# Patient Record
Sex: Male | Born: 1975 | Race: White | Hispanic: No | Marital: Married | Smoking: Never smoker
Health system: Southern US, Community
[De-identification: ages and names within clinical notes are randomized; demographics above are authoritative.]

## PROBLEM LIST (undated history)

## (undated) DIAGNOSIS — Z789 Other specified health status: Secondary | ICD-10-CM

---

## 2005-10-11 ENCOUNTER — Emergency Department: Payer: Self-pay | Admitting: Emergency Medicine

## 2009-09-16 ENCOUNTER — Emergency Department: Payer: Self-pay | Admitting: Unknown Physician Specialty

## 2021-03-17 ENCOUNTER — Other Ambulatory Visit: Payer: Self-pay | Admitting: Gastroenterology

## 2021-03-17 ENCOUNTER — Other Ambulatory Visit (HOSPITAL_COMMUNITY): Payer: Self-pay | Admitting: Gastroenterology

## 2021-03-17 DIAGNOSIS — R7401 Elevation of levels of liver transaminase levels: Secondary | ICD-10-CM

## 2021-03-17 DIAGNOSIS — K760 Fatty (change of) liver, not elsewhere classified: Secondary | ICD-10-CM

## 2021-03-23 ENCOUNTER — Observation Stay
Admission: EM | Admit: 2021-03-23 | Discharge: 2021-03-23 | Disposition: A | Discharge status: Home / Self Care | Payer: BC Managed Care – PPO | Attending: Surgery | Admitting: Surgery

## 2021-03-23 ENCOUNTER — Observation Stay: Payer: BC Managed Care – PPO

## 2021-03-23 ENCOUNTER — Emergency Department: Payer: BC Managed Care – PPO

## 2021-03-23 ENCOUNTER — Other Ambulatory Visit: Payer: Self-pay

## 2021-03-23 ENCOUNTER — Inpatient Hospital Stay (HOSPITAL_COMMUNITY)
Admission: AD | Admit: 2021-03-23 | Discharge: 2021-03-27 | DRG: 418 | Disposition: A | Discharge status: Home / Self Care | Payer: BC Managed Care – PPO | Source: Other Acute Inpatient Hospital | Attending: General Surgery | Admitting: General Surgery

## 2021-03-23 DIAGNOSIS — Z6841 Body Mass Index (BMI) 40.0 and over, adult: Secondary | ICD-10-CM | POA: Diagnosis not present

## 2021-03-23 DIAGNOSIS — K8050 Calculus of bile duct without cholangitis or cholecystitis without obstruction: Principal | ICD-10-CM | POA: Diagnosis present

## 2021-03-23 DIAGNOSIS — K8071 Calculus of gallbladder and bile duct without cholecystitis with obstruction: Secondary | ICD-10-CM | POA: Diagnosis not present

## 2021-03-23 DIAGNOSIS — R109 Unspecified abdominal pain: Secondary | ICD-10-CM

## 2021-03-23 DIAGNOSIS — K760 Fatty (change of) liver, not elsewhere classified: Secondary | ICD-10-CM | POA: Diagnosis present

## 2021-03-23 DIAGNOSIS — R7401 Elevation of levels of liver transaminase levels: Secondary | ICD-10-CM | POA: Diagnosis not present

## 2021-03-23 DIAGNOSIS — K8064 Calculus of gallbladder and bile duct with chronic cholecystitis without obstruction: Principal | ICD-10-CM | POA: Diagnosis present

## 2021-03-23 DIAGNOSIS — R1011 Right upper quadrant pain: Secondary | ICD-10-CM | POA: Diagnosis present

## 2021-03-23 DIAGNOSIS — R7989 Other specified abnormal findings of blood chemistry: Secondary | ICD-10-CM | POA: Diagnosis not present

## 2021-03-23 DIAGNOSIS — R1013 Epigastric pain: Secondary | ICD-10-CM | POA: Diagnosis present

## 2021-03-23 DIAGNOSIS — Z20822 Contact with and (suspected) exposure to covid-19: Secondary | ICD-10-CM | POA: Diagnosis not present

## 2021-03-23 DIAGNOSIS — K810 Acute cholecystitis: Secondary | ICD-10-CM | POA: Diagnosis present

## 2021-03-23 LAB — URINALYSIS, COMPLETE (UACMP) WITH MICROSCOPIC
Bacteria, UA: NONE SEEN
Bilirubin Urine: NEGATIVE
Glucose, UA: NEGATIVE mg/dL
Hgb urine dipstick: NEGATIVE
Ketones, ur: NEGATIVE mg/dL
Leukocytes,Ua: NEGATIVE
Nitrite: NEGATIVE
Protein, ur: NEGATIVE mg/dL
Specific Gravity, Urine: 1.027 (ref 1.005–1.030)
Squamous Epithelial / HPF: NONE SEEN (ref 0–5)
pH: 6 (ref 5.0–8.0)

## 2021-03-23 LAB — CBC WITH DIFFERENTIAL/PLATELET
Abs Immature Granulocytes: 0.02 10*3/uL (ref 0.00–0.07)
Basophils Absolute: 0.1 10*3/uL (ref 0.0–0.1)
Basophils Relative: 1 %
Eosinophils Absolute: 0.2 10*3/uL (ref 0.0–0.5)
Eosinophils Relative: 3 %
HCT: 44.6 % (ref 39.0–52.0)
Hemoglobin: 14.9 g/dL (ref 13.0–17.0)
Immature Granulocytes: 0 %
Lymphocytes Relative: 14 %
Lymphs Abs: 0.8 10*3/uL (ref 0.7–4.0)
MCH: 29.4 pg (ref 26.0–34.0)
MCHC: 33.4 g/dL (ref 30.0–36.0)
MCV: 88 fL (ref 80.0–100.0)
Monocytes Absolute: 0.4 10*3/uL (ref 0.1–1.0)
Monocytes Relative: 7 %
Neutro Abs: 4.2 10*3/uL (ref 1.7–7.7)
Neutrophils Relative %: 75 %
Platelets: 191 10*3/uL (ref 150–400)
RBC: 5.07 MIL/uL (ref 4.22–5.81)
RDW: 14.3 % (ref 11.5–15.5)
WBC: 5.6 10*3/uL (ref 4.0–10.5)
nRBC: 0 % (ref 0.0–0.2)

## 2021-03-23 LAB — BASIC METABOLIC PANEL
Anion gap: 10 (ref 5–15)
BUN: 10 mg/dL (ref 6–20)
CO2: 24 mmol/L (ref 22–32)
Calcium: 9.1 mg/dL (ref 8.9–10.3)
Chloride: 106 mmol/L (ref 98–111)
Creatinine, Ser: 0.81 mg/dL (ref 0.61–1.24)
GFR, Estimated: >60 mL/min (ref >60)
Glucose, Bld: 111 mg/dL — ABNORMAL HIGH (ref 70–99)
Potassium: 3.5 mmol/L (ref 3.5–5.1)
Sodium: 140 mmol/L (ref 135–145)

## 2021-03-23 LAB — HEPATIC FUNCTION PANEL
ALT: 239 U/L — ABNORMAL HIGH (ref 0–44)
AST: 237 U/L — ABNORMAL HIGH (ref 15–41)
Albumin: 4.4 g/dL (ref 3.5–5.0)
Alkaline Phosphatase: 155 U/L — ABNORMAL HIGH (ref 38–126)
Bilirubin, Direct: 1 mg/dL — ABNORMAL HIGH (ref 0.0–0.2)
Indirect Bilirubin: 1.3 mg/dL — ABNORMAL HIGH (ref 0.3–0.9)
Total Bilirubin: 2.3 mg/dL — ABNORMAL HIGH (ref 0.3–1.2)
Total Protein: 7.8 g/dL (ref 6.5–8.1)

## 2021-03-23 LAB — RESP PANEL BY RT-PCR (FLU A&B, COVID) ARPGX2
Influenza A by PCR: NEGATIVE
Influenza B by PCR: NEGATIVE
SARS Coronavirus 2 by RT PCR: NEGATIVE

## 2021-03-23 LAB — HIV ANTIBODY (ROUTINE TESTING W REFLEX): HIV Screen 4th Generation wRfx: NONREACTIVE

## 2021-03-23 LAB — LIPASE, BLOOD: Lipase: 37 U/L (ref 11–51)

## 2021-03-23 MED ORDER — SODIUM CHLORIDE 0.9 % IV SOLN
2.0000 g | INTRAVENOUS | Status: DC
  Administered 2021-03-23: 2 g via INTRAVENOUS
  Filled 2021-03-23: qty 20
  Filled 2021-03-23: qty 2

## 2021-03-23 MED ORDER — DIPHENHYDRAMINE HCL 25 MG PO CAPS
25.0000 mg | ORAL_CAPSULE | Freq: Four times a day (QID) | ORAL | Status: DC | PRN
  Administered 2021-03-27: 25 mg via ORAL
  Filled 2021-03-23: qty 1

## 2021-03-23 MED ORDER — MORPHINE SULFATE (PF) 4 MG/ML IV SOLN
4.0000 mg | Freq: Once | INTRAVENOUS | Status: AC
  Administered 2021-03-23: 4 mg via INTRAVENOUS
  Filled 2021-03-23: qty 1

## 2021-03-23 MED ORDER — DOCUSATE SODIUM 100 MG PO CAPS: 100.0000 mg | ORAL_CAPSULE | Freq: Two times a day (BID) | ORAL | Status: DC | PRN

## 2021-03-23 MED ORDER — TRAMADOL HCL 50 MG PO TABS: 50.0000 mg | ORAL_TABLET | Freq: Four times a day (QID) | ORAL | Status: DC | PRN

## 2021-03-23 MED ORDER — ONDANSETRON 4 MG PO TBDP: 4.0000 mg | ORAL_TABLET | Freq: Four times a day (QID) | ORAL | Status: DC | PRN

## 2021-03-23 MED ORDER — GADOBUTROL 1 MMOL/ML IV SOLN
10.0000 mL | Freq: Once | INTRAVENOUS | Status: AC | PRN
  Administered 2021-03-23: 10 mL via INTRAVENOUS

## 2021-03-23 MED ORDER — ENOXAPARIN SODIUM 40 MG/0.4ML ~~LOC~~ SOLN
40.0000 mg | SUBCUTANEOUS | Status: DC
  Administered 2021-03-24 – 2021-03-26 (×3): 40 mg via SUBCUTANEOUS
  Filled 2021-03-23 (×3): qty 0.4

## 2021-03-23 MED ORDER — ACETAMINOPHEN 650 MG RE SUPP: 650.0000 mg | Freq: Four times a day (QID) | RECTAL | Status: DC | PRN

## 2021-03-23 MED ORDER — ONDANSETRON HCL 4 MG/2ML IJ SOLN
4.0000 mg | Freq: Once | INTRAMUSCULAR | Status: AC
  Administered 2021-03-23: 4 mg via INTRAVENOUS
  Filled 2021-03-23: qty 2

## 2021-03-23 MED ORDER — ONDANSETRON HCL 4 MG/2ML IJ SOLN: 4.0000 mg | Freq: Four times a day (QID) | INTRAMUSCULAR | Status: DC | PRN

## 2021-03-23 MED ORDER — METOPROLOL TARTRATE 5 MG/5ML IV SOLN: 5.0000 mg | Freq: Four times a day (QID) | INTRAVENOUS | Status: DC | PRN

## 2021-03-23 MED ORDER — ONDANSETRON HCL 4 MG/2ML IJ SOLN
4.0000 mg | Freq: Four times a day (QID) | INTRAMUSCULAR | Status: DC | PRN
  Administered 2021-03-24 – 2021-03-26 (×2): 4 mg via INTRAVENOUS
  Filled 2021-03-23 (×3): qty 2

## 2021-03-23 MED ORDER — DEXTROSE-NACL 5-0.45 % IV SOLN
INTRAVENOUS | Status: DC
  Administered 2021-03-24: 850 mL via INTRAVENOUS

## 2021-03-23 MED ORDER — HYDROCODONE-ACETAMINOPHEN 5-325 MG PO TABS: 1.0000 | ORAL_TABLET | ORAL | Status: DC | PRN

## 2021-03-23 MED ORDER — MORPHINE SULFATE (PF) 2 MG/ML IV SOLN: 2.0000 mg | INTRAVENOUS | Status: DC | PRN

## 2021-03-23 MED ORDER — DIPHENHYDRAMINE HCL 50 MG/ML IJ SOLN
25.0000 mg | Freq: Four times a day (QID) | INTRAMUSCULAR | Status: DC | PRN
Start: 2021-03-23 — End: 2021-03-27
  Administered 2021-03-25: 25 mg via INTRAVENOUS
  Filled 2021-03-23: qty 1

## 2021-03-23 MED ORDER — MORPHINE SULFATE (PF) 2 MG/ML IV SOLN
2.0000 mg | INTRAVENOUS | Status: DC | PRN
  Administered 2021-03-24 – 2021-03-26 (×4): 2 mg via INTRAVENOUS
  Filled 2021-03-23 (×4): qty 1

## 2021-03-23 MED ORDER — SODIUM CHLORIDE 0.9 % IV SOLN: INTRAVENOUS | Status: DC

## 2021-03-23 MED ORDER — OXYCODONE HCL 5 MG PO TABS
5.0000 mg | ORAL_TABLET | ORAL | Status: DC | PRN
  Administered 2021-03-25 – 2021-03-26 (×2): 5 mg via ORAL
  Filled 2021-03-23 (×3): qty 1

## 2021-03-23 MED ORDER — SODIUM CHLORIDE 0.9 % IV BOLUS
500.0000 mL | Freq: Once | INTRAVENOUS | Status: AC
  Administered 2021-03-23: 500 mL via INTRAVENOUS

## 2021-03-23 MED ORDER — ACETAMINOPHEN 325 MG PO TABS: 650.0000 mg | ORAL_TABLET | Freq: Four times a day (QID) | ORAL | Status: DC | PRN

## 2021-03-23 MED ORDER — SODIUM CHLORIDE 0.9 % IV SOLN
2.0000 g | INTRAVENOUS | Status: DC
  Administered 2021-03-24 – 2021-03-26 (×4): 2 g via INTRAVENOUS
  Filled 2021-03-23: qty 20
  Filled 2021-03-23: qty 2
  Filled 2021-03-23: qty 20
  Filled 2021-03-23: qty 2
  Filled 2021-03-23: qty 20

## 2021-03-23 NOTE — Progress Notes (Signed)
Pt arrival to unit. Admission assessment complete. Signed and held orders released. Orders reviewed. Pt and spouse oriented to room and unit. Pt NPO at this time and agreeable to plan of care. Pt denies any pain or discomfort. Call light in reach, safety measures in place. Pt aware to call with any needs/concerns.

## 2021-03-23 NOTE — Progress Notes (Signed)
Pt to be transported to [address] [facility] [address] [address], [place]. Report called to receiving nurse. Care link to transport pt to [facility].

## 2021-03-23 NOTE — ED Notes (Signed)
Pt remains in MRI at this time  

## 2021-03-23 NOTE — H&P (Signed)
Reason for Consult: choledocholithiasis Referring Physician: Domenic MorasSakai  Zarin W Marter is an 45 y.o. male.  HPI: 45 yo male presented with epigastric abdominal pain.  The pain was nonradiating. It was worse with food. It was better with pain medication. It was not constant but came and went. He was seen by [facility] surgery. He was found to have small stone in CBD on MRCP and [facility]. Upon arrival he did not have any pain or nausea.   No past medical history on file.  No past surgical history on file.  No family history on file.  Social History:  reports that he has never smoked. He has never used smokeless tobacco. He reports that he does not drink alcohol and does not use drugs.  Allergies: No Known Allergies  Medications: I have reviewed the patient's current medications.  Results for orders placed or performed during the hospital encounter of 03/23/21 (from the past 48 hour(s))  Basic metabolic panel     Status: Abnormal   Collection Time: 03/23/21  3:57 AM  Result Value Ref Range   Sodium 140 135 - 145 mmol/L   Potassium 3.5 3.5 - 5.1 mmol/L   Chloride 106 98 - 111 mmol/L   CO2 24 22 - 32 mmol/L   Glucose, Bld 111 (H) 70 - 99 mg/dL    Comment: Glucose reference range applies only to samples taken after fasting for at least 8 hours.   BUN 10 6 - 20 mg/dL   Creatinine, Ser 1.610.81 0.61 - 1.24 mg/dL   Calcium 9.1 8.9 - 09.610.3 mg/dL   GFR, Estimated >04>60 >54>60 mL/min    Comment: (NOTE) Calculated using the CKD-EPI Creatinine Equation (2021)    Anion gap 10 5 - 15    Comment: Performed at [facility], [address] [place], [place] [address]  Hepatic function panel     Status: Abnormal   Collection Time: 03/23/21  3:57 AM  Result Value Ref Range   Total Protein 7.8 6.5 - 8.1 g/dL   Albumin 4.4 3.5 - 5.0 g/dL   AST 914237 (H) 15 - 41 U/L   ALT 239 (H) 0 - 44 U/L   Alkaline Phosphatase 155 (H) 38 - 126 U/L   Total Bilirubin 2.3 (H) 0.3 - 1.2  mg/dL   Bilirubin, Direct 1.0 (H) 0.0 - 0.2 mg/dL   Indirect Bilirubin 1.3 (H) 0.3 - 0.9 mg/dL    Comment: Performed at [facility], [address] [place], [place] [address]  Lipase, blood     Status: None   Collection Time: 03/23/21  3:57 AM  Result Value Ref Range   Lipase 37 11 - 51 U/L    Comment: Performed at [facility], [address] [place], [place] [address]  CBC with Differential     Status: None   Collection Time: 03/23/21  3:57 AM  Result Value Ref Range   WBC 5.6 4.0 - 10.5 K/uL   RBC 5.07 4.22 - 5.81 MIL/uL   Hemoglobin 14.9 13.0 - 17.0 g/dL   HCT 86.544.6 78.439.0 - 69.652.0 %   MCV 88.0 80.0 - 100.0 fL   MCH 29.4 26.0 - 34.0 pg   MCHC 33.4 30.0 - 36.0 g/dL   RDW 29.514.3 28.411.5 - 13.215.5 %   Platelets 191 150 - 400 K/uL   nRBC 0.0 0.0 - 0.2 %   Neutrophils Relative % 75 %   Neutro Abs 4.2 1.7 - 7.7 K/uL  Lymphocytes Relative 14 %   Lymphs Abs 0.8 0.7 - 4.0 K/uL   Monocytes Relative 7 %   Monocytes Absolute 0.4 0.1 - 1.0 K/uL   Eosinophils Relative 3 %   Eosinophils Absolute 0.2 0.0 - 0.5 K/uL   Basophils Relative 1 %   Basophils Absolute 0.1 0.0 - 0.1 K/uL   Immature Granulocytes 0 %   Abs Immature Granulocytes 0.02 0.00 - 0.07 K/uL    Comment: Performed at [facility], [address] [place], [place] [address]  Urinalysis, Complete w Microscopic Urine, Clean Catch     Status: Abnormal   Collection Time: 03/23/21  3:57 AM  Result Value Ref Range   Color, Urine AMBER (A) YELLOW    Comment: BIOCHEMICALS MAY BE AFFECTED BY COLOR   APPearance CLEAR (A) CLEAR   Specific Gravity, Urine 1.027 1.005 - 1.030   pH 6.0 5.0 - 8.0   Glucose, UA NEGATIVE NEGATIVE mg/dL   Hgb urine dipstick NEGATIVE NEGATIVE   Bilirubin Urine NEGATIVE NEGATIVE   Ketones, ur NEGATIVE NEGATIVE mg/dL   Protein, ur NEGATIVE NEGATIVE mg/dL   Nitrite NEGATIVE NEGATIVE   Leukocytes,Ua NEGATIVE NEGATIVE   RBC / HPF 0-5 0 - 5 RBC/hpf   WBC, UA 0-5 0 - 5 WBC/hpf    Bacteria, UA NONE SEEN NONE SEEN   Squamous Epithelial / LPF NONE SEEN 0 - 5   Mucus PRESENT     Comment: Performed at [facility], [address], [place], [place] [address]  Resp Panel by RT-PCR (Flu A&B, Covid) Nasopharyngeal Swab     Status: None   Collection Time: 03/23/21  5:49 AM   Specimen: Nasopharyngeal Swab; Nasopharyngeal(NP) swabs in vial transport medium  Result Value Ref Range   SARS Coronavirus 2 by RT PCR NEGATIVE NEGATIVE    Comment: (NOTE) SARS-CoV-2 target nucleic acids are NOT DETECTED.  The SARS-CoV-2 RNA is generally detectable in upper respiratory specimens during the acute phase of infection. The lowest concentration of SARS-CoV-2 viral copies this assay can detect is 138 copies/mL. A negative result does not preclude SARS-Cov-2 infection and should not be used as the sole basis for treatment or other patient management decisions. A negative result may occur with  improper specimen collection/handling, submission of specimen other than nasopharyngeal swab, presence of viral mutation(s) within the areas targeted by this assay, and inadequate number of viral copies(<138 copies/mL). A negative result must be combined with clinical observations, patient history, and epidemiological information. The expected result is Negative.  Fact Sheet for Patients:  BloggerCourse.com  Fact Sheet for Healthcare Providers:  SeriousBroker.it  This test is no t yet approved or cleared by the [place] FDA and  has been authorized for detection and/or diagnosis of SARS-CoV-2 by FDA under an Emergency Use Authorization (EUA). This EUA will remain  in effect (meaning this test can be used) for the duration of the COVID-19 declaration under Section 564(b)(1) of the Act, 21 U.S.C.section 360bbb-3(b)(1), unless the authorization is terminated  or revoked sooner.       Influenza A by PCR NEGATIVE NEGATIVE    Influenza B by PCR NEGATIVE NEGATIVE    Comment: (NOTE) The Xpert Xpress SARS-CoV-2/FLU/RSV plus assay is intended as an aid in the diagnosis of influenza from Nasopharyngeal swab specimens and should not be used as a sole basis for treatment. Nasal washings and aspirates are unacceptable for Xpert Xpress SARS-CoV-2/FLU/RSV testing.  Fact Sheet for Patients: BloggerCourse.com  Fact Sheet for Healthcare Providers: SeriousBroker.it  This test is not yet approved or cleared by the [place] and has been authorized for detection and/or diagnosis of SARS-CoV-2 by FDA under an Emergency Use Authorization (EUA). This EUA will remain in effect (meaning this test can be used) for the duration of the COVID-19 declaration under Section 564(b)(1) of the Act, 21 U.S.C. section 360bbb-3(b)(1), unless the authorization is terminated or revoked.  Performed at [facility], [address] [place], [place] [address]   HIV Antibody (routine testing w rflx)     Status: None   Collection Time: 03/23/21  8:47 AM  Result Value Ref Range   HIV Screen 4th Generation wRfx Non Reactive Non Reactive    Comment: Performed at [facility], [address], [place], [place] [address]    MR 3D Recon At Scanner  Result Date: 03/23/2021 CLINICAL DATA:  Epigastric and right upper quadrant abdominal pain since 8 p.m. last night. Cholelithiasis on ultrasound. EXAM: MRI ABDOMEN WITHOUT AND WITH CONTRAST (INCLUDING MRCP) TECHNIQUE: Multiplanar multisequence MR imaging of the abdomen was performed both before and after the administration of intravenous contrast. Heavily T2-weighted images of the biliary and pancreatic ducts were obtained, and three-dimensional MRCP images were rendered by post processing. CONTRAST:  74mL GADAVIST GADOBUTROL 1 MMOL/ML IV SOLN COMPARISON:  03/23/2021 abdominal sonogram. 09/16/2009 CT abdomen/pelvis. FINDINGS: Lower  chest: No acute abnormality at the lung bases. Hepatobiliary: Normal liver size and configuration. No significant hepatic steatosis. Simple 1.5 cm inferior right liver cyst. No suspicious liver masses. Numerous gallstones layering within the nondistended gallbladder, largest 1.4 cm. No definite gallbladder wall thickening. No pericholecystic fluid. No biliary ductal dilatation. Common bile duct diameter 3 mm. There is a solitary a 3 mm stone in the lower third of the common bile duct (series 11/image 21). No additional biliary filling defects. No biliary beading or strictures. Pancreas: Tiny unilocular 0.5 cm cystic lesion in the anterior uncinate process of the pancreas (series 4/image 29) without wall thickening or solid enhancement. No additional pancreatic lesions. No pancreatic duct dilation. There is pancreas divisum. Spleen: Mild splenomegaly. Craniocaudal splenic length 13.1 cm. No splenic mass. Adrenals/Urinary Tract: Right adrenal 1.3 cm nodule with loss of signal intensity on out of phase chemical shift imaging compatible with an adenoma. No left adrenal nodules. No hydronephrosis. Multiple small simple renal cortical cysts in both kidneys, largest 2.0 cm in the upper right kidney. No suspicious renal masses. Stomach/Bowel: Normal non-distended stomach. Visualized small and large bowel is normal caliber, with no bowel wall thickening. Vascular/Lymphatic: Normal caliber abdominal aorta. Patent portal, splenic, hepatic and renal veins. Mildly enlarged porta hepatis nodes up to the 1.0 cm (series 23/image 35). Other: No abdominal ascites or focal fluid collection. Musculoskeletal: No aggressive appearing focal osseous lesions. Left T12 vertebral hemangioma. IMPRESSION: 1. Cholelithiasis. No MRI findings of acute cholecystitis. 2. Solitary 3 mm choledocholith in the lower third of the common bile duct. No biliary ductal dilatation. CBD diameter 3 mm. 3. Mild porta hepatis lymphadenopathy, nonspecific. Mild  splenomegaly. Suggest attention on follow-up CT abdomen/pelvis with oral and IV contrast in 3 months. 4. Pancreas divisum. 5. Small right adrenal adenoma. Electronically Signed   By: Delbert Phenix M.D.   On: 03/23/2021 08:31   MR ABDOMEN MRCP W WO CONTAST  Result Date: 03/23/2021 CLINICAL DATA:  Epigastric and right upper quadrant abdominal pain since 8 p.m. last night. Cholelithiasis on ultrasound. EXAM: MRI ABDOMEN WITHOUT AND WITH CONTRAST (INCLUDING MRCP) TECHNIQUE: Multiplanar multisequence MR imaging of the  abdomen was performed both before and after the administration of intravenous contrast. Heavily T2-weighted images of the biliary and pancreatic ducts were obtained, and three-dimensional MRCP images were rendered by post processing. CONTRAST:  75mL GADAVIST GADOBUTROL 1 MMOL/ML IV SOLN COMPARISON:  03/23/2021 abdominal sonogram. 09/16/2009 CT abdomen/pelvis. FINDINGS: Lower chest: No acute abnormality at the lung bases. Hepatobiliary: Normal liver size and configuration. No significant hepatic steatosis. Simple 1.5 cm inferior right liver cyst. No suspicious liver masses. Numerous gallstones layering within the nondistended gallbladder, largest 1.4 cm. No definite gallbladder wall thickening. No pericholecystic fluid. No biliary ductal dilatation. Common bile duct diameter 3 mm. There is a solitary a 3 mm stone in the lower third of the common bile duct (series 11/image 21). No additional biliary filling defects. No biliary beading or strictures. Pancreas: Tiny unilocular 0.5 cm cystic lesion in the anterior uncinate process of the pancreas (series 4/image 29) without wall thickening or solid enhancement. No additional pancreatic lesions. No pancreatic duct dilation. There is pancreas divisum. Spleen: Mild splenomegaly. Craniocaudal splenic length 13.1 cm. No splenic mass. Adrenals/Urinary Tract: Right adrenal 1.3 cm nodule with loss of signal intensity on out of phase chemical shift imaging compatible  with an adenoma. No left adrenal nodules. No hydronephrosis. Multiple small simple renal cortical cysts in both kidneys, largest 2.0 cm in the upper right kidney. No suspicious renal masses. Stomach/Bowel: Normal non-distended stomach. Visualized small and large bowel is normal caliber, with no bowel wall thickening. Vascular/Lymphatic: Normal caliber abdominal aorta. Patent portal, splenic, hepatic and renal veins. Mildly enlarged porta hepatis nodes up to the 1.0 cm (series 23/image 35). Other: No abdominal ascites or focal fluid collection. Musculoskeletal: No aggressive appearing focal osseous lesions. Left T12 vertebral hemangioma. IMPRESSION: 1. Cholelithiasis. No MRI findings of acute cholecystitis. 2. Solitary 3 mm choledocholith in the lower third of the common bile duct. No biliary ductal dilatation. CBD diameter 3 mm. 3. Mild porta hepatis lymphadenopathy, nonspecific. Mild splenomegaly. Suggest attention on follow-up CT abdomen/pelvis with oral and IV contrast in 3 months. 4. Pancreas divisum. 5. Small right adrenal adenoma. Electronically Signed   By: Delbert Phenix M.D.   On: 03/23/2021 08:31   US Abdomen Limited RUQ (LIVER/GB)  Result Date: 03/23/2021 CLINICAL DATA:  45 year old male with acute right upper quadrant pain since 2000 hours. EXAM: ULTRASOUND ABDOMEN LIMITED RIGHT UPPER QUADRANT COMPARISON:  CT Abdomen and Pelvis 09/16/2009. FINDINGS: Gallbladder: Gallstones in the gallbladder neck individually up to 12 mm (image 13). These appear to be mo bile on left lateral decubitus images (image 60). Gallbladder wall thickness is at the upper limits of normal, 2-3 mm. No pericholecystic fluid. No sonographic Murphy sign elicited. Common bile duct: Diameter: 5-6 mm, upper limits of normal. Liver: Echogenic liver (image 162). No intrahepatic biliary ductal dilatation. No discrete liver lesion. Portal vein is patent on color Doppler imaging with normal direction of blood flow towards the liver. Other:  Negative visible right kidney IMPRESSION: 1. Cholelithiasis. No strong evidence of acute cholecystitis or bile duct obstruction. 2. Fatty liver disease. Electronically Signed   By: Odessa Fleming M.D.   On: 03/23/2021 05:18    Review of Systems  Constitutional: Negative for chills and fever.  HENT: Negative for hearing loss.   Eyes: Negative for blurred vision and double vision.  Respiratory: Negative for cough and hemoptysis.   Cardiovascular: Negative for chest pain and palpitations.  Gastrointestinal: Negative for abdominal pain, nausea and vomiting.  Genitourinary: Negative for dysuria and urgency.  Musculoskeletal: Negative  for myalgias and neck pain.  Skin: Negative for itching and rash.  Neurological: Negative for dizziness, tingling and headaches.  Endo/Heme/Allergies: Does not bruise/bleed easily.  Psychiatric/Behavioral: Negative for depression and suicidal ideas.    PE There were no vitals taken for this visit. Constitutional: NAD; conversant; no deformities Eyes: Moist conjunctiva; no lid lag; anicteric; PERRL Neck: Trachea midline; no thyromegaly Lungs: Normal respiratory effort; no tactile fremitus CV: RRR; no palpable thrills; no pitting edema GI: Abd soft, NT, ND; no palpable hepatosplenomegaly MSK: Normal gait; no clubbing/cyanosis Psychiatric: Appropriate affect; alert and oriented x3 Lymphatic: No palpable cervical or axillary lymphadenopathy Skin: No major subcutaneous nodules. Warm and dry   Assessment/Plan: 45 yo male with choledocholithiasis, direct bili 1.0. -recheck labs in am -GI consult for possible ERCP -NPO after midnight -IV abx -pain control   [place] Yolunda Kloos 03/23/2021, 11:52 PM

## 2021-03-23 NOTE — Progress Notes (Signed)
Subjective:  CC: Thomas Thompson is a 45 y.o. male  Hospital stay day 0,   choledocolithiasis  HPI: No issues since last exam  ROS:  General: Denies weight loss, weight gain, fatigue, fevers, chills, and night sweats. Heart: Denies chest pain, palpitations, racing heart, irregular heartbeat, leg pain or swelling, and decreased activity tolerance. Respiratory: Denies breathing difficulty, shortness of breath, wheezing, cough, and sputum. GI: Denies change in appetite, heartburn, nausea, vomiting, constipation, diarrhea, and blood in stool. GU: Denies difficulty urinating, pain with urinating, urgency, frequency, blood in urine.   Objective:   Temp:  [98 F (36.7 C)] 98 F (36.7 C) (04/03 0826) Pulse Rate:  [50-68] 50 (04/03 0826) Resp:  [14-28] 14 (04/03 0742) BP: (109-150)/(67-133) 115/79 (04/03 0826) SpO2:  [97 %-99 %] 97 % (04/03 0826) Weight:  [145.2 kg] 145.2 kg (04/03 0342)     Height: 6\' 2"  (188 cm) Weight: (!) 145.2 kg BMI (Calculated): 41.07   Intake/Output this shift:   Intake/Output Summary (Last 24 hours) at 03/23/2021 1041 Last data filed at 03/23/2021 0452 Gross per 24 hour  Intake 500 ml  Output --  Net 500 ml    Constitutional :  alert, cooperative, and appears stated age  Respiratory:  clear to auscultation bilaterally  Cardiovascular:  regular rate and rhythm  Gastrointestinal: soft, non-tender; bowel sounds normal; no masses,  no organomegaly.   Skin: Cool and moist.   Psychiatric: Normal affect, non-agitated, not confused       LABS:  CMP Latest Ref Rng & Units 03/23/2021  Glucose 70 - 99 mg/dL 05/23/2021)  BUN 6 - 20 mg/dL 10  Creatinine 937(T - 0.24 mg/dL 0.97  Sodium 3.53 - 299 mmol/L 140  Potassium 3.5 - 5.1 mmol/L 3.5  Chloride 98 - 111 mmol/L 106  CO2 22 - 32 mmol/L 24  Calcium 8.9 - 10.3 mg/dL 9.1  Total Protein 6.5 - 8.1 g/dL 7.8  Total Bilirubin 0.3 - 1.2 mg/dL 2.3(H)  Alkaline Phos 38 - 126 U/L 155(H)  AST 15 - 41 U/L 237(H)  ALT 0 - 44 U/L  239(H)   CBC Latest Ref Rng & Units 03/23/2021  WBC 4.0 - 10.5 K/uL 5.6  Hemoglobin 13.0 - 17.0 g/dL 05/23/2021  Hematocrit 68.3 - 52.0 % 44.6  Platelets 150 - 400 K/uL 191    RADS: MRCP positive for CBD stone Assessment:   Choledocolithisis.  Will need ERCP, service not available at [facility].  Discussed case with [place] and Dr. [facility] kindly accepted transfer.

## 2021-03-23 NOTE — ED Triage Notes (Signed)
Pt presents to ER c/o upper abd pain since 2000 yesterday.  Pt states pain radiates from upper abdomen into back.  Pt A&Ox4 at this time.  Pt supposed to get [place] of abdomen on Tuesday, but pain became worse today. Pt states pain is worse tonight than it has been before.

## 2021-03-23 NOTE — ED Notes (Signed)
Pt back to room from MRI.

## 2021-03-23 NOTE — ED Provider Notes (Signed)
[facility] Emergency Department Provider Note ____________________________________________   Event Date/Time   First MD Initiated Contact with Patient 03/23/21 [phone]     (approximate)  I have reviewed the triage vital signs and the nursing notes.   HISTORY  Chief Complaint Abdominal Pain    HPI Thomas Thompson is a 45 y.o. male with no active medical problems who presents with epigastric and right upper quadrant abdominal pain, this episode starting acutely around 8 PM last night, persistent course since then, and associated with sensation of abdominal bloating.  He denies nausea, vomiting, or diarrhea.  The patient states that he has been having similar episodes of pain over approximately the last month and saw GI a few days ago.  He is scheduled for an outpatient ultrasound this week.  He states that this episode of the pain is the most severe that it has been.  The episodes sometimes occur right after eating and sometimes a while later.  He had a fajita for dinner but did not eat anything unusual last night.   History reviewed. No pertinent past medical history.  Patient Active Problem List   Diagnosis Date Noted  . Acute cholecystitis 03/23/2021    History reviewed. No pertinent surgical history.  Prior to Admission medications   Not on File    Allergies Patient has no known allergies.  History reviewed. No pertinent family history.  Social History Social History   Tobacco Use  . Smoking status: Never Smoker  . Smokeless tobacco: Never Used  Substance Use Topics  . Alcohol use: Never  . Drug use: Never    Review of Systems  Constitutional: No fever. Eyes: No visual changes. ENT: No sore throat. Cardiovascular: Denies chest pain. Respiratory: Denies shortness of breath. Gastrointestinal: No vomiting or diarrhea. Genitourinary: Negative for flank pain. Musculoskeletal: Negative for back pain. Skin: Negative for  rash. Neurological: Negative for headache.   ____________________________________________   PHYSICAL EXAM:  VITAL SIGNS: ED Triage Vitals  Enc Vitals Group     BP 03/23/21 0341 (!) 150/133     Pulse Rate 03/23/21 0341 60     Resp 03/23/21 0341 20     Temp 03/23/21 0341 98 F (36.7 C)     Temp Source 03/23/21 0341 Oral     SpO2 03/23/21 0341 99 %     Weight 03/23/21 0342 (!) 320 lb (145.2 kg)     Height 03/23/21 0342 6\' 2"  (1.88 m)     Head Circumference --      Peak Flow --      Pain Score 03/23/21 0341 10     Pain Loc --      Pain Edu? --      Excl. in GC? --     Constitutional: Alert and oriented.  Uncomfortable appearing but in no acute distress. Eyes: Conjunctivae are normal.  Head: Atraumatic. Nose: No congestion/rhinnorhea. Mouth/Throat: Mucous membranes are moist.   Neck: Normal range of motion.  Cardiovascular: Normal rate, regular rhythm.  Good peripheral circulation. Respiratory: Normal respiratory effort.  No retractions. Gastrointestinal: Soft with moderate right upper quadrant tenderness.  No distention.  Genitourinary: No flank tenderness. Musculoskeletal: Extremities warm and well perfused.  Neurologic:  Normal speech and language. No gross focal neurologic deficits are appreciated.  Skin:  Skin is warm and dry. No rash noted. Psychiatric: Mood and affect are normal. Speech and behavior are normal.  ____________________________________________   LABS (all labs ordered are listed, but only abnormal results are  displayed)  Labs Reviewed  BASIC METABOLIC PANEL - Abnormal; Notable for the following components:      Result Value   Glucose, Bld 111 (*)    All other components within normal limits  HEPATIC FUNCTION PANEL - Abnormal; Notable for the following components:   AST 237 (*)    ALT 239 (*)    Alkaline Phosphatase 155 (*)    Total Bilirubin 2.3 (*)    Bilirubin, Direct 1.0 (*)    Indirect Bilirubin 1.3 (*)    All other components within  normal limits  URINALYSIS, COMPLETE (UACMP) WITH MICROSCOPIC - Abnormal; Notable for the following components:   Color, Urine AMBER (*)    APPearance CLEAR (*)    All other components within normal limits  RESP PANEL BY RT-PCR (FLU A&B, COVID) ARPGX2  LIPASE, BLOOD  CBC WITH DIFFERENTIAL/PLATELET   ____________________________________________  EKG  ED ECG REPORT I, Dionne Bucy, the attending physician, personally viewed and interpreted this ECG.  Date: 03/23/2021 EKG Time: 0349 Rate: 58 Rhythm: normal sinus rhythm QRS Axis: normal Intervals: normal ST/T Wave abnormalities: normal Narrative Interpretation: no evidence of acute ischemia ____________________________________________  RADIOLOGY  US abdomen RUQ: Cholelithiasis with stones in the gallbladder neck  ____________________________________________   PROCEDURES  Procedure(s) performed: No  Procedures  Critical Care performed: No ____________________________________________   INITIAL IMPRESSION / ASSESSMENT AND PLAN / ED COURSE  Pertinent labs & imaging results that were available during my care of the patient were reviewed by me and considered in my medical decision making (see chart for details).  45 year old male with no active medical problems presents with epigastric and right upper quadrant pain since 8 PM last night associated with abdominal bloating.  He has had these episodes for about a month but states that today it is more severe.  I reviewed the past medical records in Epic and Care Everywhere..  The patient has not been to the ED here previously.  He was seen by GI in 3/28 for epigastric pain and abdominal bloating.  Labs at that time revealed mild transaminitis.  He was scheduled for an outpatient ultrasound this week but this has not yet been completed.  On exam the patient is uncomfortable but not acutely ill-appearing.  His vital signs are normal except for mild hypertension.  He does  have some tenderness in the right upper quadrant.  Differential includes biliary colic, cholecystitis, pancreatitis, other hepatobiliary cause, gastritis, PUD, GERD.  We will obtain labs and a right upper quadrant ultrasound.  If this work-up is unrevealing the patient may need a CT for further evaluation.  ----------------------------------------- 5:34 AM on 03/23/2021 -----------------------------------------  Ultrasound shows cholelithiasis with stones in the gallbladder neck but no obvious signs of acute cholecystitis or bile duct obstruction, however given the elevated LFTs which have worsened from his outpatient visit a few days ago I am concerned for biliary obstruction.  I consulted Dr. Tonna Boehringer from general surgery to evaluate the patient.  ----------------------------------------- 6:02 AM on 03/23/2021 -----------------------------------------  Dr. Tonna Boehringer advises that he will admit the patient.  He has requested an MRCP, which I have ordered.  ____________________________________________   FINAL CLINICAL IMPRESSION(S) / ED DIAGNOSES  Final diagnoses:  Right upper quadrant pain      NEW MEDICATIONS STARTED DURING THIS VISIT:  New Prescriptions   No medications on file     Note:  This document was prepared using Dragon voice recognition software and may include unintentional dictation errors.   Dionne Bucy, MD 03/23/21 [phone]

## 2021-03-23 NOTE — H&P (Signed)
Subjective:   CC: Acute cholecystitis, elevated LFTs  HPI:  Thomas Thompson is a 45 y.o. male who is consulted by [facility] for evaluation of above cc.  Symptoms were first noted 1 day ago. Pain is dull, constant.  Associated with bloating, exacerbated by nothing specific.  Similar but less intense episodes of bloating, heartburn noted the past couple months.     Past Medical History: None reported  Past Surgical History: None reported  Family History: Reviewed and not relevant to exam  Social History:  reports that he has never smoked. He has never used smokeless tobacco. He reports that he does not drink alcohol and does not use drugs.  Current Medications:  Prior to Admission medications   Not on File    Allergies:  Allergies as of 03/23/2021  . (No Known Allergies)    ROS:  General: Denies weight loss, weight gain, fatigue, fevers, chills, and night sweats. Eyes: Denies blurry vision, double vision, eye pain, itchy eyes, and tearing. Ears: Denies hearing loss, earache, and ringing in ears. Nose: Denies sinus pain, congestion, infections, runny nose, and nosebleeds. Mouth/throat: Denies hoarseness, sore throat, bleeding gums, and difficulty swallowing. Heart: Denies chest pain, palpitations, racing heart, irregular heartbeat, leg pain or swelling, and decreased activity tolerance. Respiratory: Denies breathing difficulty, shortness of breath, wheezing, cough, and sputum. GI: Denies change in appetite, heartburn, nausea, vomiting, constipation, diarrhea, and blood in stool. GU: Denies difficulty urinating, pain with urinating, urgency, frequency, blood in urine. Musculoskeletal: Denies joint stiffness, pain, swelling, muscle weakness. Skin: Denies rash, itching, mass, tumors, sores, and boils Neurologic: Denies headache, fainting, dizziness, seizures, numbness, and tingling. Psychiatric: Denies depression, anxiety, difficulty sleeping, and memory loss. Endocrine: Denies  heat or cold intolerance, and increased thirst or urination. Blood/lymph: Denies easy bruising, and swollen glands     Objective:     BP 121/74   Pulse 60   Temp 98 F (36.7 C) (Oral)   Resp (!) 28   Ht 6\' 2"  (1.88 m)   Wt (!) 145.2 kg   SpO2 98%   BMI 41.09 kg/m    Constitutional :  alert, cooperative, appears stated age and no distress  Lymphatics/Throat:  no asymmetry, masses, or scars  Respiratory:  clear to auscultation bilaterally  Cardiovascular:  regular rate and rhythm  Gastrointestinal: Soft, no guarding, minimal tenderness to palpation in right upper quadrant.   Musculoskeletal: Steady movement  Skin: Cool and moist  Psychiatric: Normal affect, non-agitated, not confused       LABS:  CMP Latest Ref Rng & Units 03/23/2021  Glucose 70 - 99 mg/dL 05/23/2021)  BUN 6 - 20 mg/dL 10  Creatinine 401(U - 2.72 mg/dL 5.36  Sodium 6.44 - 034 mmol/L 140  Potassium 3.5 - 5.1 mmol/L 3.5  Chloride 98 - 111 mmol/L 106  CO2 22 - 32 mmol/L 24  Calcium 8.9 - 10.3 mg/dL 9.1  Total Protein 6.5 - 8.1 g/dL 7.8  Total Bilirubin 0.3 - 1.2 mg/dL 2.3(H)  Alkaline Phos 38 - 126 U/L 155(H)  AST 15 - 41 U/L 237(H)  ALT 0 - 44 U/L 239(H)   CBC Latest Ref Rng & Units 03/23/2021  WBC 4.0 - 10.5 K/uL 5.6  Hemoglobin 13.0 - 17.0 g/dL 05/23/2021  Hematocrit 59.5 - 52.0 % 44.6  Platelets 150 - 400 K/uL 191     RADS: CLINICAL DATA:  45 year old male with acute right upper quadrant pain since 2000 hours.  EXAM: ULTRASOUND ABDOMEN LIMITED RIGHT UPPER QUADRANT  COMPARISON:  CT Abdomen and Pelvis 09/16/2009.  FINDINGS: Gallbladder:  Gallstones in the gallbladder neck individually up to 12 mm (image 13). These appear to be mo bile on left lateral decubitus images (image 60). Gallbladder wall thickness is at the upper limits of normal, 2-3 mm. No pericholecystic fluid. No sonographic Murphy sign elicited.  Common bile duct:  Diameter: 5-6 mm, upper limits of  normal.  Liver:  Echogenic liver (image 162). No intrahepatic biliary ductal dilatation. No discrete liver lesion. Portal vein is patent on color Doppler imaging with normal direction of blood flow towards the liver.  Other: Negative visible right kidney  IMPRESSION: 1. Cholelithiasis. No strong evidence of acute cholecystitis or bile duct obstruction. 2. Fatty liver disease.   Electronically Signed   By: Odessa Fleming M.D.   On: 03/23/2021 05:18  Assessment:      Acute cholecystitis Elevated LFTs  Plan:      Discussed the risk of surgery including post-op infxn, seroma, biloma, chronic pain, poor-delayed wound healing, retained gallstone, conversion to open procedure, post-op SBO or ileus, and need for additional procedures to address said risks.  The risks of general anesthetic including MI, CVA, sudden death or even reaction to anesthetic medications also discussed. Alternatives include continued observation.  Benefits include possible symptom relief, prevention of complications including acute cholecystitis, pancreatitis.  Typical post operative recovery of 3-5 days rest, continued pain in area and incision sites, possible loose stools up to 4-6 weeks, also discussed.  The patient understands the risks, any and all questions were answered to the patient's satisfaction.'  Tentatively scheduled for OR for robotic assisted lap chole.  We will proceed with MRCP prior to due to elevated LFTs and some chronic nature of this presentation to ensure no choledocholithiasis.  Or need to consider ERCP prior to cholecystectomy if MRCP shows persistent choledocholithiasis.   IV fluids, IV antibiotics, n.p.o., pain control in the meantime.

## 2021-03-24 ENCOUNTER — Encounter (HOSPITAL_COMMUNITY): Payer: Self-pay | Admitting: Certified Registered Nurse Anesthetist

## 2021-03-24 ENCOUNTER — Ambulatory Visit (HOSPITAL_COMMUNITY): Admission: RE | Admit: 2021-03-24 | Payer: BC Managed Care – PPO | Source: Ambulatory Visit

## 2021-03-24 ENCOUNTER — Encounter (HOSPITAL_COMMUNITY)
Admission: AD | Disposition: A | Discharge status: Home / Self Care | Payer: Self-pay | Source: Other Acute Inpatient Hospital

## 2021-03-24 ENCOUNTER — Encounter (HOSPITAL_COMMUNITY): Payer: Self-pay

## 2021-03-24 DIAGNOSIS — R7989 Other specified abnormal findings of blood chemistry: Secondary | ICD-10-CM | POA: Diagnosis not present

## 2021-03-24 DIAGNOSIS — K8050 Calculus of bile duct without cholangitis or cholecystitis without obstruction: Secondary | ICD-10-CM | POA: Diagnosis not present

## 2021-03-24 LAB — CBC
HCT: 40.5 % (ref 39.0–52.0)
Hemoglobin: 13.4 g/dL (ref 13.0–17.0)
MCH: 30 pg (ref 26.0–34.0)
MCHC: 33.1 g/dL (ref 30.0–36.0)
MCV: 90.6 fL (ref 80.0–100.0)
Platelets: 158 10*3/uL (ref 150–400)
RBC: 4.47 MIL/uL (ref 4.22–5.81)
RDW: 14.6 % (ref 11.5–15.5)
WBC: 4 10*3/uL (ref 4.0–10.5)
nRBC: 0 % (ref 0.0–0.2)

## 2021-03-24 LAB — COMPREHENSIVE METABOLIC PANEL
ALT: 262 U/L — ABNORMAL HIGH (ref 0–44)
AST: 160 U/L — ABNORMAL HIGH (ref 15–41)
Albumin: 3.4 g/dL — ABNORMAL LOW (ref 3.5–5.0)
Alkaline Phosphatase: 153 U/L — ABNORMAL HIGH (ref 38–126)
Anion gap: 6 (ref 5–15)
BUN: 8 mg/dL (ref 6–20)
CO2: 25 mmol/L (ref 22–32)
Calcium: 8.7 mg/dL — ABNORMAL LOW (ref 8.9–10.3)
Chloride: 106 mmol/L (ref 98–111)
Creatinine, Ser: 0.8 mg/dL (ref 0.61–1.24)
GFR, Estimated: >60 mL/min (ref >60)
Glucose, Bld: 89 mg/dL (ref 70–99)
Potassium: 3.5 mmol/L (ref 3.5–5.1)
Sodium: 137 mmol/L (ref 135–145)
Total Bilirubin: 2.1 mg/dL — ABNORMAL HIGH (ref 0.3–1.2)
Total Protein: 6.1 g/dL — ABNORMAL LOW (ref 6.5–8.1)

## 2021-03-24 SURGERY — LAPAROSCOPIC CHOLECYSTECTOMY WITH INTRAOPERATIVE CHOLANGIOGRAM
Anesthesia: General

## 2021-03-24 NOTE — Discharge Summary (Signed)
Physician Discharge Summary  Patient ID: Thomas Thompson MRN: [id number] DOB/AGE: 1976-03-08 45 y.o.  Admit date: 03/23/2021 Discharge date: 03/23/21  Admission Diagnoses: elevated LFTs  Discharge Diagnoses:  Same as above plus choledocolithiasis  Discharged Condition: good  Hospital Course: admitted for above, presumed acute chole as well, but MRCP showed choledocolithiasis so requested transfer to [facility] due to lack of ERCP capability at this time.  Consults: None  Discharge Exam: Blood pressure 108/73, pulse (!) 47, temperature 97.9 F (36.6 C), temperature source Oral, resp. rate 19, height 6\' 2"  (1.88 m), weight (!) 145.2 kg, SpO2 96 %. General appearance: alert, cooperative and no distress GI: soft, non-tender; bowel sounds normal; no masses,  no organomegaly  Disposition:  transfer to [facility]   Allergies as of 03/23/2021   No Known Allergies     Medication List    ASK your doctor about these medications   acidophilus Caps capsule Take 1 capsule by mouth daily.   HYDROcodone-acetaminophen 5-325 MG tablet Commonly known as: NORCO/VICODIN Take 1 tablet by mouth every 4 (four) hours as needed for pain.   omeprazole 40 MG capsule Commonly known as: PRILOSEC Take 1 capsule by mouth daily. Ask about: Which instructions should I use?         Total time spent arranging discharge was >61min, including coordinating transfer Signed: 31m 03/24/2021, 9:32 AM

## 2021-03-24 NOTE — Progress Notes (Signed)
Progress Note     Subjective: Patient reports abdominal pain, nausea and vomiting have resolved. He has not had anything to eat/drink since midnight. Discussed possibility of ERCP vs OR today and patient in agreement.   Objective: Vital signs in last 24 hours: Temp:  [97.7 F (36.5 C)-98 F (36.7 C)] 98 F (36.7 C) (04/04 0559) Pulse Rate:  [47-55] 51 (04/04 0559) Resp:  [18-19] 18 (04/04 0559) BP: (104-123)/(69-73) 113/70 (04/04 0559) SpO2:  [94 %-98 %] 94 % (04/04 0559) Last BM Date: 03/22/21  Intake/Output from previous day: 04/03 0701 - 04/04 0700 In: 794.2 [I.V.:794.2] Out: 600 [Urine:600] Intake/Output this shift: No intake/output data recorded.  PE: General: pleasant, WD, obese male who is laying in bed in NAD HEENT: Sclera are anicteric.  Heart: regular, rate, and rhythm.   Lungs: CTAB, no wheezes, rhonchi, or rales noted.  Respiratory effort nonlabored Abd: soft, NT, ND, +BS, no masses, hernias, or organomegaly MS: all 4 extremities are symmetrical with no cyanosis, clubbing, or edema. Skin: warm and dry with no masses, lesions, or rashes Neuro: Cranial nerves 2-12 grossly intact, sensation is normal throughout Psych: A&Ox3 with an appropriate affect.    Lab Results:  Recent Labs    03/23/21 0357 03/24/21 0133  WBC 5.6 4.0  HGB 14.9 13.4  HCT 44.6 40.5  PLT 191 158   BMET Recent Labs    03/23/21 0357 03/24/21 0133  NA 140 137  K 3.5 3.5  CL 106 106  CO2 24 25  GLUCOSE 111* 89  BUN 10 8  CREATININE 0.81 0.80  CALCIUM 9.1 8.7*   PT/INR No results for input(s): LABPROT, INR in the last 72 hours. CMP     Component Value Date/Time   NA 137 03/24/2021 0133   K 3.5 03/24/2021 0133   CL 106 03/24/2021 0133   CO2 25 03/24/2021 0133   GLUCOSE 89 03/24/2021 0133   BUN 8 03/24/2021 0133   CREATININE 0.80 03/24/2021 0133   CALCIUM 8.7 (L) 03/24/2021 0133   PROT 6.1 (L) 03/24/2021 0133   ALBUMIN 3.4 (L) 03/24/2021 0133   AST 160 (H)  03/24/2021 0133   ALT 262 (H) 03/24/2021 0133   ALKPHOS 153 (H) 03/24/2021 0133   BILITOT 2.1 (H) 03/24/2021 0133   GFRNONAA >60 03/24/2021 0133   Lipase     Component Value Date/Time   LIPASE 37 03/23/2021 0357       Studies/Results: MR 3D Recon At Scanner  Result Date: 03/23/2021 CLINICAL DATA:  Epigastric and right upper quadrant abdominal pain since 8 p.m. last night. Cholelithiasis on ultrasound. EXAM: MRI ABDOMEN WITHOUT AND WITH CONTRAST (INCLUDING MRCP) TECHNIQUE: Multiplanar multisequence MR imaging of the abdomen was performed both before and after the administration of intravenous contrast. Heavily T2-weighted images of the biliary and pancreatic ducts were obtained, and three-dimensional MRCP images were rendered by post processing. CONTRAST:  42mL GADAVIST GADOBUTROL 1 MMOL/ML IV SOLN COMPARISON:  03/23/2021 abdominal sonogram. 09/16/2009 CT abdomen/pelvis. FINDINGS: Lower chest: No acute abnormality at the lung bases. Hepatobiliary: Normal liver size and configuration. No significant hepatic steatosis. Simple 1.5 cm inferior right liver cyst. No suspicious liver masses. Numerous gallstones layering within the nondistended gallbladder, largest 1.4 cm. No definite gallbladder wall thickening. No pericholecystic fluid. No biliary ductal dilatation. Common bile duct diameter 3 mm. There is a solitary a 3 mm stone in the lower third of the common bile duct (series 11/image 21). No additional biliary filling defects. No biliary beading or strictures.  Pancreas: Tiny unilocular 0.5 cm cystic lesion in the anterior uncinate process of the pancreas (series 4/image 29) without wall thickening or solid enhancement. No additional pancreatic lesions. No pancreatic duct dilation. There is pancreas divisum. Spleen: Mild splenomegaly. Craniocaudal splenic length 13.1 cm. No splenic mass. Adrenals/Urinary Tract: Right adrenal 1.3 cm nodule with loss of signal intensity on out of phase chemical shift  imaging compatible with an adenoma. No left adrenal nodules. No hydronephrosis. Multiple small simple renal cortical cysts in both kidneys, largest 2.0 cm in the upper right kidney. No suspicious renal masses. Stomach/Bowel: Normal non-distended stomach. Visualized small and large bowel is normal caliber, with no bowel wall thickening. Vascular/Lymphatic: Normal caliber abdominal aorta. Patent portal, splenic, hepatic and renal veins. Mildly enlarged porta hepatis nodes up to the 1.0 cm (series 23/image 35). Other: No abdominal ascites or focal fluid collection. Musculoskeletal: No aggressive appearing focal osseous lesions. Left T12 vertebral hemangioma. IMPRESSION: 1. Cholelithiasis. No MRI findings of acute cholecystitis. 2. Solitary 3 mm choledocholith in the lower third of the common bile duct. No biliary ductal dilatation. CBD diameter 3 mm. 3. Mild porta hepatis lymphadenopathy, nonspecific. Mild splenomegaly. Suggest attention on follow-up CT abdomen/pelvis with oral and IV contrast in 3 months. 4. Pancreas divisum. 5. Small right adrenal adenoma. Electronically Signed   By: Delbert Phenix M.D.   On: 03/23/2021 08:31   MR ABDOMEN MRCP W WO CONTAST  Result Date: 03/23/2021 CLINICAL DATA:  Epigastric and right upper quadrant abdominal pain since 8 p.m. last night. Cholelithiasis on ultrasound. EXAM: MRI ABDOMEN WITHOUT AND WITH CONTRAST (INCLUDING MRCP) TECHNIQUE: Multiplanar multisequence MR imaging of the abdomen was performed both before and after the administration of intravenous contrast. Heavily T2-weighted images of the biliary and pancreatic ducts were obtained, and three-dimensional MRCP images were rendered by post processing. CONTRAST:  66mL GADAVIST GADOBUTROL 1 MMOL/ML IV SOLN COMPARISON:  03/23/2021 abdominal sonogram. 09/16/2009 CT abdomen/pelvis. FINDINGS: Lower chest: No acute abnormality at the lung bases. Hepatobiliary: Normal liver size and configuration. No significant hepatic steatosis.  Simple 1.5 cm inferior right liver cyst. No suspicious liver masses. Numerous gallstones layering within the nondistended gallbladder, largest 1.4 cm. No definite gallbladder wall thickening. No pericholecystic fluid. No biliary ductal dilatation. Common bile duct diameter 3 mm. There is a solitary a 3 mm stone in the lower third of the common bile duct (series 11/image 21). No additional biliary filling defects. No biliary beading or strictures. Pancreas: Tiny unilocular 0.5 cm cystic lesion in the anterior uncinate process of the pancreas (series 4/image 29) without wall thickening or solid enhancement. No additional pancreatic lesions. No pancreatic duct dilation. There is pancreas divisum. Spleen: Mild splenomegaly. Craniocaudal splenic length 13.1 cm. No splenic mass. Adrenals/Urinary Tract: Right adrenal 1.3 cm nodule with loss of signal intensity on out of phase chemical shift imaging compatible with an adenoma. No left adrenal nodules. No hydronephrosis. Multiple small simple renal cortical cysts in both kidneys, largest 2.0 cm in the upper right kidney. No suspicious renal masses. Stomach/Bowel: Normal non-distended stomach. Visualized small and large bowel is normal caliber, with no bowel wall thickening. Vascular/Lymphatic: Normal caliber abdominal aorta. Patent portal, splenic, hepatic and renal veins. Mildly enlarged porta hepatis nodes up to the 1.0 cm (series 23/image 35). Other: No abdominal ascites or focal fluid collection. Musculoskeletal: No aggressive appearing focal osseous lesions. Left T12 vertebral hemangioma. IMPRESSION: 1. Cholelithiasis. No MRI findings of acute cholecystitis. 2. Solitary 3 mm choledocholith in the lower third of the common bile duct.  No biliary ductal dilatation. CBD diameter 3 mm. 3. Mild porta hepatis lymphadenopathy, nonspecific. Mild splenomegaly. Suggest attention on follow-up CT abdomen/pelvis with oral and IV contrast in 3 months. 4. Pancreas divisum. 5. Small  right adrenal adenoma. Electronically Signed   By: Delbert Phenix M.D.   On: 03/23/2021 08:31   US Abdomen Limited RUQ (LIVER/GB)  Result Date: 03/23/2021 CLINICAL DATA:  45 year old male with acute right upper quadrant pain since 2000 hours. EXAM: ULTRASOUND ABDOMEN LIMITED RIGHT UPPER QUADRANT COMPARISON:  CT Abdomen and Pelvis 09/16/2009. FINDINGS: Gallbladder: Gallstones in the gallbladder neck individually up to 12 mm (image 13). These appear to be mo bile on left lateral decubitus images (image 60). Gallbladder wall thickness is at the upper limits of normal, 2-3 mm. No pericholecystic fluid. No sonographic Murphy sign elicited. Common bile duct: Diameter: 5-6 mm, upper limits of normal. Liver: Echogenic liver (image 162). No intrahepatic biliary ductal dilatation. No discrete liver lesion. Portal vein is patent on color Doppler imaging with normal direction of blood flow towards the liver. Other: Negative visible right kidney IMPRESSION: 1. Cholelithiasis. No strong evidence of acute cholecystitis or bile duct obstruction. 2. Fatty liver disease. Electronically Signed   By: Odessa Fleming M.D.   On: 03/23/2021 05:18    Anti-infectives: Anti-infectives (From admission, onward)   Start     Dose/Rate Route Frequency Ordered Stop   03/24/21 1000  cefTRIAXone (ROCEPHIN) 2 g in sodium chloride 0.9 % 100 mL IVPB        2 g 200 mL/hr over 30 Minutes Intravenous Every 24 hours 03/23/21 1926         Assessment/Plan Morbid Obesity - BMI 41.09  Choledocholithiasis - Tbili 2.1 from 2.3 on admit, AST trending down, ALT up minimally - no leukocytosis and symptoms have resolved - MRCP with 3 mm stone in CBD - GI to see - if plans for ERCP today, then will plan lap chole tomorrow - if no plans for ERCP today, will consider lap chole with IOC  FEN: NPO, IVF VTE: lovenox ID: [email or web address] 4/4  LOS: 1 day    Juliet Rude , [facility] Surgery 03/24/2021, 9:22 AM Please see Amion for pager number  during day hours 7:00am-4:30pm

## 2021-03-24 NOTE — H&P (View-Only) (Signed)
[facility] Consult: 9:55 AM 03/24/2021  LOS: 1 day    Referring Provider: Dr Kieth Brightly  Primary Care Physician:  Pcp, No Primary Gastroenterologist: Dr Loney Hering in [place]. Wife [name] [phone]    Reason for Consultation:  CBD stone.     HPI: Thomas Thompson is a 45 y.o. male.  PMH obesity.  For about a month he has been having episodes of discomfort in the right upper quadrant without nausea.  He self-induced vomiting to relieve pressure which was not very helpful in reducing the pain.  Also took simethicone without much relief.  Went to an urgent care, prescribed omeprazole which the heartburn but not the episodes of pain.  He stopped taking this in the last week or so.  Referred to Dr. Loney Hering GI specialist in [place].  Labs obtained and LFTs were abnormal.  GI MD attributed this to likely fatty liver though the patient had not had any imaging.  He saw the GI MD again 1 week ago Monday and was prescribed FD guard which she has not started yet.  On Tuesday he had several hours of pain in the right upper quadrant that lasted most of the day, the pressure was such he felt like he was going to explode.  Took the Phazyme and charcoal tablets without relief no pain relievers. In recent days developed epigastric abdominal pain without radiation, fluctuating pain that came and went.  Presented to [facility] and transferred to [facility] hospital for advanced care.  Given there is been no nausea.  His weight is stable over many months.  Has never had EGD or colonoscopy.   T bili 2.3 >> 2.1.  Alk phos 155 >> 53.  AST/ALT 237/239 >> 160/262.  No renal insufficiency or electrolyte disturbances.  CBC normal Abdominal ultrasound limited: Cholelithiasis without strong evidence for acute  cholecystitis.  CBD 5 to 6 mm.  Fatty liver.  PV patent with normal flow. MRI abdomen, MRCP: Cholelithiasis.  3 mm stone in the lower third of CBD, CBD diameter 3 mm.  Nonspecific porta hepatis lymphadenopathy.  Mild splenomegaly, suggest follow-up CTAP in 3 months.  Pancreas divisum.  Small right adrenal adenoma.  No alcohol.  No tobacco.  Family history positive for gallbladder disease in his mother and his aunt's.  Works as a [employer] administrator.    No past medical history on file.  No past surgical history on file.  Prior to Admission medications   Medication Sig Start Date End Date Taking? Authorizing Provider  acidophilus (RISAQUAD) CAPS capsule Take 1 capsule by mouth daily.    [provider]  HYDROcodone-acetaminophen (NORCO/VICODIN) 5-325 MG tablet Take 1 tablet by mouth every 4 (four) hours as needed for pain. 05/07/17   [provider]  omeprazole (PRILOSEC) 40 MG capsule Take 1 capsule by mouth daily. 03/14/21   [provider]    Scheduled Meds: . enoxaparin (LOVENOX) injection  40 mg Subcutaneous Q24H   Infusions: . cefTRIAXone (ROCEPHIN)  IV 2 g (03/24/21 0944)  . dextrose 5 %  and 0.45% NaCl 100 mL/hr at 03/24/21 0715   PRN Meds: acetaminophen **OR** acetaminophen, diphenhydrAMINE **OR** diphenhydrAMINE, metoprolol tartrate, morphine injection, ondansetron **OR** ondansetron (ZOFRAN) IV, oxyCODONE   Allergies as of 03/23/2021  . (No Known Allergies)    No family history on file.  Social History   Socioeconomic History  . Marital status: Single    Spouse name: Not on file  . Number of children: Not on file  . Years of education: Not on file  . Highest education level: Not on file  Occupational History  . Not on file  Tobacco Use  . Smoking status: Never Smoker  . Smokeless tobacco: Never Used  Substance and Sexual Activity  . Alcohol use: Never  . Drug use: Never  . Sexual activity: Not on file  Other Topics Concern  .  Not on file  Social History Narrative  . Not on file   Social Determinants of Health   Financial Resource Strain: Not on file  Food Insecurity: Not on file  Transportation Needs: Not on file  Physical Activity: Not on file  Stress: Not on file  Social Connections: Not on file  Intimate Partner Violence: Not on file    REVIEW OF SYSTEMS: Constitutional: Lucky Rathke is better today. ENT:  No nose bleeds Pulm: No shortness of breath or cough. CV:  No palpitations, no LE edema.  GU:  No hematuria, no frequency GI: See HPI. GU:   No dark-colored urine. Heme: No unusual bleeding or bruising Transfusions: None Neuro:  No headaches, no peripheral tingling or numbness Derm:  No itching, no rash or sores.  No jaundice Endocrine:  No sweats or chills.  No polyuria or dysuria Immunization: Not queried. 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   PHYSICAL EXAM: Vital signs in last 24 hours: Vitals:   03/24/21 0244 03/24/21 0559  BP: 123/70 113/70  Pulse: (!) 55 (!) 51  Resp: 18 18  Temp: 97.7 F (36.5 C) 98 F (36.7 C)  SpO2: 98% 94%   Wt Readings from Last 3 Encounters:  03/23/21 (!) 145.2 kg    General: Pleasant, obese, nonill appearing, comfortable. Head: No facial asymmetry or swelling.  No signs of head trauma. Eyes: No scleral icterus Ears: Not hard of hearing Nose: No discharge or congestion Mouth: Tongue midline.  Mucosa moist, pink, clear. Neck: No JVD, no thyromegaly, no masses Lungs: Good breath sounds bilaterally.  No labored breathing.  Lungs clear. Heart: RRR.  No MRG.  S1, S2 present. Abdomen: Obese, soft, nontender.  No HSM, masses, bruits, hernias appreciated.  Active bowel sounds..   Rectal: Deferred Musc/Skeltl: No joint redness, swelling or gross deformities Extremities: No CCE. Neurologic: Oriented x3. Skin: No jaundice.  No telangiectasia. Nodes: No cervical adenopathy. Psych: Pleasant, cooperative, calm,  fluid speech.  Intake/Output from previous day: 04/03 0701 - 04/04 0700 In: 794.2 [I.V.:794.2] Out: 600 [Urine:600] Intake/Output this shift: No intake/output data recorded.  LAB RESULTS: Recent Labs    03/23/21 0357 03/24/21 0133  WBC 5.6 4.0  HGB 14.9 13.4  HCT 44.6 40.5  PLT 191 158   BMET Lab Results  Component Value Date   NA 137 03/24/2021   NA 140 03/23/2021   K 3.5 03/24/2021   K 3.5 03/23/2021   CL 106 03/24/2021   CL 106 03/23/2021   CO2 25 03/24/2021   CO2 24 03/23/2021   GLUCOSE 89 03/24/2021   GLUCOSE 111 (H) 03/23/2021  BUN 8 03/24/2021   BUN 10 03/23/2021   CREATININE 0.80 03/24/2021   CREATININE 0.81 03/23/2021   CALCIUM 8.7 (L) 03/24/2021   CALCIUM 9.1 03/23/2021   LFT Recent Labs    03/23/21 0357 03/24/21 0133  PROT 7.8 6.1*  ALBUMIN 4.4 3.4*  AST 237* 160*  ALT 239* 262*  ALKPHOS 155* 153*  BILITOT 2.3* 2.1*  BILIDIR 1.0*  --   IBILI 1.3*  --    PT/INR No results found for: INR, PROTIME Hepatitis Panel No results for input(s): HEPBSAG, HCVAB, HEPAIGM, HEPBIGM in the last 72 hours. C-Diff No components found for: CDIFF Lipase     Component Value Date/Time   LIPASE 37 03/23/2021 0357    Drugs of Abuse  No results found for: LABOPIA, COCAINSCRNUR, LABBENZ, AMPHETMU, THCU, LABBARB   RADIOLOGY STUDIES: MR 3D Recon At Scanner  Result Date: 03/23/2021 CLINICAL DATA:  Epigastric and right upper quadrant abdominal pain since 8 p.m. last night. Cholelithiasis on ultrasound. EXAM: MRI ABDOMEN WITHOUT AND WITH CONTRAST (INCLUDING MRCP) TECHNIQUE: Multiplanar multisequence MR imaging of the abdomen was performed both before and after the administration of intravenous contrast. Heavily T2-weighted images of the biliary and pancreatic ducts were obtained, and three-dimensional MRCP images were rendered by post processing. CONTRAST:  68m GADAVIST GADOBUTROL 1 MMOL/ML IV SOLN COMPARISON:  03/23/2021 abdominal sonogram. 09/16/2009 CT  abdomen/pelvis. FINDINGS: Lower chest: No acute abnormality at the lung bases. Hepatobiliary: Normal liver size and configuration. No significant hepatic steatosis. Simple 1.5 cm inferior right liver cyst. No suspicious liver masses. Numerous gallstones layering within the nondistended gallbladder, largest 1.4 cm. No definite gallbladder wall thickening. No pericholecystic fluid. No biliary ductal dilatation. Common bile duct diameter 3 mm. There is a solitary a 3 mm stone in the lower third of the common bile duct (series 11/image 21). No additional biliary filling defects. No biliary beading or strictures. Pancreas: Tiny unilocular 0.5 cm cystic lesion in the anterior uncinate process of the pancreas (series 4/image 29) without wall thickening or solid enhancement. No additional pancreatic lesions. No pancreatic duct dilation. There is pancreas divisum. Spleen: Mild splenomegaly. Craniocaudal splenic length 13.1 cm. No splenic mass. Adrenals/Urinary Tract: Right adrenal 1.3 cm nodule with loss of signal intensity on out of phase chemical shift imaging compatible with an adenoma. No left adrenal nodules. No hydronephrosis. Multiple small simple renal cortical cysts in both kidneys, largest 2.0 cm in the upper right kidney. No suspicious renal masses. Stomach/Bowel: Normal non-distended stomach. Visualized small and large bowel is normal caliber, with no bowel wall thickening. Vascular/Lymphatic: Normal caliber abdominal aorta. Patent portal, splenic, hepatic and renal veins. Mildly enlarged porta hepatis nodes up to the 1.0 cm (series 23/image 35). Other: No abdominal ascites or focal fluid collection. Musculoskeletal: No aggressive appearing focal osseous lesions. Left T12 vertebral hemangioma. IMPRESSION: 1. Cholelithiasis. No MRI findings of acute cholecystitis. 2. Solitary 3 mm choledocholith in the lower third of the common bile duct. No biliary ductal dilatation. CBD diameter 3 mm. 3. Mild porta hepatis  lymphadenopathy, nonspecific. Mild splenomegaly. Suggest attention on follow-up CT abdomen/pelvis with oral and IV contrast in 3 months. 4. Pancreas divisum. 5. Small right adrenal adenoma. Electronically Signed   By: JIlona SorrelM.D.   On: 03/23/2021 08:31   MR ABDOMEN MRCP W WO CONTAST  Result Date: 03/23/2021 CLINICAL DATA:  Epigastric and right upper quadrant abdominal pain since 8 p.m. last night. Cholelithiasis on ultrasound. EXAM: MRI ABDOMEN WITHOUT AND WITH CONTRAST (INCLUDING MRCP) TECHNIQUE: Multiplanar multisequence  MR imaging of the abdomen was performed both before and after the administration of intravenous contrast. Heavily T2-weighted images of the biliary and pancreatic ducts were obtained, and three-dimensional MRCP images were rendered by post processing. CONTRAST:  77m GADAVIST GADOBUTROL 1 MMOL/ML IV SOLN COMPARISON:  03/23/2021 abdominal sonogram. 09/16/2009 CT abdomen/pelvis. FINDINGS: Lower chest: No acute abnormality at the lung bases. Hepatobiliary: Normal liver size and configuration. No significant hepatic steatosis. Simple 1.5 cm inferior right liver cyst. No suspicious liver masses. Numerous gallstones layering within the nondistended gallbladder, largest 1.4 cm. No definite gallbladder wall thickening. No pericholecystic fluid. No biliary ductal dilatation. Common bile duct diameter 3 mm. There is a solitary a 3 mm stone in the lower third of the common bile duct (series 11/image 21). No additional biliary filling defects. No biliary beading or strictures. Pancreas: Tiny unilocular 0.5 cm cystic lesion in the anterior uncinate process of the pancreas (series 4/image 29) without wall thickening or solid enhancement. No additional pancreatic lesions. No pancreatic duct dilation. There is pancreas divisum. Spleen: Mild splenomegaly. Craniocaudal splenic length 13.1 cm. No splenic mass. Adrenals/Urinary Tract: Right adrenal 1.3 cm nodule with loss of signal intensity on out of phase  chemical shift imaging compatible with an adenoma. No left adrenal nodules. No hydronephrosis. Multiple small simple renal cortical cysts in both kidneys, largest 2.0 cm in the upper right kidney. No suspicious renal masses. Stomach/Bowel: Normal non-distended stomach. Visualized small and large bowel is normal caliber, with no bowel wall thickening. Vascular/Lymphatic: Normal caliber abdominal aorta. Patent portal, splenic, hepatic and renal veins. Mildly enlarged porta hepatis nodes up to the 1.0 cm (series 23/image 35). Other: No abdominal ascites or focal fluid collection. Musculoskeletal: No aggressive appearing focal osseous lesions. Left T12 vertebral hemangioma. IMPRESSION: 1. Cholelithiasis. No MRI findings of acute cholecystitis. 2. Solitary 3 mm choledocholith in the lower third of the common bile duct. No biliary ductal dilatation. CBD diameter 3 mm. 3. Mild porta hepatis lymphadenopathy, nonspecific. Mild splenomegaly. Suggest attention on follow-up CT abdomen/pelvis with oral and IV contrast in 3 months. 4. Pancreas divisum. 5. Small right adrenal adenoma. Electronically Signed   By: JIlona SorrelM.D.   On: 03/23/2021 08:31   [place]Abdomen Limited RUQ (LIVER/GB)  Result Date: 03/23/2021 CLINICAL DATA:  45year old male with acute right upper quadrant pain since 2000 hours. EXAM: ULTRASOUND ABDOMEN LIMITED RIGHT UPPER QUADRANT COMPARISON:  CT Abdomen and Pelvis 09/16/2009. FINDINGS: Gallbladder: Gallstones in the gallbladder neck individually up to 12 mm (image 13). These appear to be mo bile on left lateral decubitus images (image 60). Gallbladder wall thickness is at the upper limits of normal, 2-3 mm. No pericholecystic fluid. No sonographic Murphy sign elicited. Common bile duct: Diameter: 5-6 mm, upper limits of normal. Liver: Echogenic liver (image 162). No intrahepatic biliary ductal dilatation. No discrete liver lesion. Portal vein is patent on color Doppler imaging with normal direction of  blood flow towards the liver. Other: Negative visible right kidney IMPRESSION: 1. Cholelithiasis. No strong evidence of acute cholecystitis or bile duct obstruction. 2. Fatty liver disease. Electronically Signed   By: HGenevie AnnM.D.   On: 03/23/2021 05:18    IMPRESSION:   *   Biliary colic.  Elevated LFTs.  Cholelithiasis without cholecystitis. Choledocholithiasis.  *    Fatty liver.  Morbid obesity.    PLAN:     *   ERCP timing TBD. Pt did not receive the Lovenox which is ordered to be given every evening at 2200. According  to the surgical note from this morning, there is a possibility that pt may go to lap chole this afternoon if ERCP cannot be arranged today.      Azucena Freed  03/24/2021, 9:55 AM Phone [phone]  I have reviewed the entire case in detail with the above APP and discussed the plan in detail.  Therefore, I agree with the diagnoses recorded above. In addition,  I have personally interviewed and examined the patient, and both his wife and the surgical PA were at the bedside during my visit.  My additional thoughts are as follows:  Months of right upper quadrant pain, now diagnosed as biliary colic and choledocholithiasis. We were asked to see this patient regarding management of choledocholithiasis and timing of ERCP. The current advanced endoscopist and endoscopy department schedule will not allow a nonemergent ERCP today at [facility].  Fortunately, this patient is not showing signs of cholecystitis or cholangitis.   My recommendation is for him to undergo a preop ERCP for ductal clearance, and we currently have him on the schedule for tomorrow afternoon with Dr. Oretha Caprice.  I conveyed that plan to the surgical service. Procedure described in detail along with risks and benefits and he was agreeable.  The benefits and risks of the planned procedure were described in detail with the patient or (when appropriate) their health care proxy.  Risks were outlined as  including, but not limited to, bleeding, infection, perforation, adverse medication reaction leading to cardiac or pulmonary decompensation, pancreatitis (if ERCP).  The limitation of incomplete mucosal visualization was also discussed.  No guarantees or warranties were given.  Patient at increased risk for cardiopulmonary complications of procedure due to medical comorbidities.  (BMI)    Nelida Meuse III Office:[phone]

## 2021-03-24 NOTE — Consult Note (Addendum)
[facility] Consult: 9:55 AM 03/24/2021  LOS: 1 day    Referring Provider: Dr Kieth Brightly  Primary Care Physician:  Pcp, No Primary Gastroenterologist: Dr Loney Hering in [place]. Wife [name] [phone]    Reason for Consultation:  CBD stone.     HPI: Thomas Thompson is a 45 y.o. male.  PMH obesity.  For about a month he has been having episodes of discomfort in the right upper quadrant without nausea.  He self-induced vomiting to relieve pressure which was not very helpful in reducing the pain.  Also took simethicone without much relief.  Went to an urgent care, prescribed omeprazole which the heartburn but not the episodes of pain.  He stopped taking this in the last week or so.  Referred to Dr. Loney Hering GI specialist in [place].  Labs obtained and LFTs were abnormal.  GI MD attributed this to likely fatty liver though the patient had not had any imaging.  He saw the GI MD again 1 week ago Monday and was prescribed FD guard which she has not started yet.  On Tuesday he had several hours of pain in the right upper quadrant that lasted most of the day, the pressure was such he felt like he was going to explode.  Took the Phazyme and charcoal tablets without relief no pain relievers. In recent days developed epigastric abdominal pain without radiation, fluctuating pain that came and went.  Presented to [facility] and transferred to [facility] hospital for advanced care.  Given there is been no nausea.  His weight is stable over many months.  Has never had EGD or colonoscopy.   T bili 2.3 >> 2.1.  Alk phos 155 >> 53.  AST/ALT 237/239 >> 160/262.  No renal insufficiency or electrolyte disturbances.  CBC normal Abdominal ultrasound limited: Cholelithiasis without strong evidence for acute  cholecystitis.  CBD 5 to 6 mm.  Fatty liver.  PV patent with normal flow. MRI abdomen, MRCP: Cholelithiasis.  3 mm stone in the lower third of CBD, CBD diameter 3 mm.  Nonspecific porta hepatis lymphadenopathy.  Mild splenomegaly, suggest follow-up CTAP in 3 months.  Pancreas divisum.  Small right adrenal adenoma.  No alcohol.  No tobacco.  Family history positive for gallbladder disease in his mother and his aunt's.  Works as a Arts administrator.    No past medical history on file.  No past surgical history on file.  Prior to Admission medications   Medication Sig Start Date End Date Taking? Authorizing Provider  acidophilus (RISAQUAD) CAPS capsule Take 1 capsule by mouth daily.    [provider]  HYDROcodone-acetaminophen (NORCO/VICODIN) 5-325 MG tablet Take 1 tablet by mouth every 4 (four) hours as needed for pain. 05/07/17   [provider]  omeprazole (PRILOSEC) 40 MG capsule Take 1 capsule by mouth daily. 03/14/21   [provider]    Scheduled Meds: . enoxaparin (LOVENOX) injection  40 mg Subcutaneous Q24H   Infusions: . cefTRIAXone (ROCEPHIN)  IV 2 g (03/24/21 0944)  . dextrose 5 %  and 0.45% NaCl 100 mL/hr at 03/24/21 0715   PRN Meds: acetaminophen **OR** acetaminophen, diphenhydrAMINE **OR** diphenhydrAMINE, metoprolol tartrate, morphine injection, ondansetron **OR** ondansetron (ZOFRAN) IV, oxyCODONE   Allergies as of 03/23/2021  . (No Known Allergies)    No family history on file.  Social History   Socioeconomic History  . Marital status: Single    Spouse name: Not on file  . Number of children: Not on file  . Years of education: Not on file  . Highest education level: Not on file  Occupational History  . Not on file  Tobacco Use  . Smoking status: Never Smoker  . Smokeless tobacco: Never Used  Substance and Sexual Activity  . Alcohol use: Never  . Drug use: Never  . Sexual activity: Not on file  Other Topics Concern  .  Not on file  Social History Narrative  . Not on file   Social Determinants of Health   Financial Resource Strain: Not on file  Food Insecurity: Not on file  Transportation Needs: Not on file  Physical Activity: Not on file  Stress: Not on file  Social Connections: Not on file  Intimate Partner Violence: Not on file    REVIEW OF SYSTEMS: Constitutional: Thomas Thompson is better today. ENT:  No nose bleeds Pulm: No shortness of breath or cough. CV:  No palpitations, no LE edema.  GU:  No hematuria, no frequency GI: See HPI. GU:   No dark-colored urine. Heme: No unusual bleeding or bruising Transfusions: None Neuro:  No headaches, no peripheral tingling or numbness Derm:  No itching, no rash or sores.  No jaundice Endocrine:  No sweats or chills.  No polyuria or dysuria Immunization: Not queried. 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   PHYSICAL EXAM: Vital signs in last 24 hours: Vitals:   03/24/21 0244 03/24/21 0559  BP: 123/70 113/70  Pulse: (!) 55 (!) 51  Resp: 18 18  Temp: 97.7 F (36.5 C) 98 F (36.7 C)  SpO2: 98% 94%   Wt Readings from Last 3 Encounters:  03/23/21 (!) 145.2 kg    General: Pleasant, obese, nonill appearing, comfortable. Head: No facial asymmetry or swelling.  No signs of head trauma. Eyes: No scleral icterus Ears: Not hard of hearing Nose: No discharge or congestion Mouth: Tongue midline.  Mucosa moist, pink, clear. Neck: No JVD, no thyromegaly, no masses Lungs: Good breath sounds bilaterally.  No labored breathing.  Lungs clear. Heart: RRR.  No MRG.  S1, S2 present. Abdomen: Obese, soft, nontender.  No HSM, masses, bruits, hernias appreciated.  Active bowel sounds..   Rectal: Deferred Musc/Skeltl: No joint redness, swelling or gross deformities Extremities: No CCE. Neurologic: Oriented x3. Skin: No jaundice.  No telangiectasia. Nodes: No cervical adenopathy. Psych: Pleasant, cooperative, calm,  fluid speech.  Intake/Output from previous day: 04/03 0701 - 04/04 0700 In: 794.2 [I.V.:794.2] Out: 600 [Urine:600] Intake/Output this shift: No intake/output data recorded.  LAB RESULTS: Recent Labs    03/23/21 0357 03/24/21 0133  WBC 5.6 4.0  HGB 14.9 13.4  HCT 44.6 40.5  PLT 191 158   BMET Lab Results  Component Value Date   NA 137 03/24/2021   NA 140 03/23/2021   K 3.5 03/24/2021   K 3.5 03/23/2021   CL 106 03/24/2021   CL 106 03/23/2021   CO2 25 03/24/2021   CO2 24 03/23/2021   GLUCOSE 89 03/24/2021   GLUCOSE 111 (H) 03/23/2021  BUN 8 03/24/2021   BUN 10 03/23/2021   CREATININE 0.80 03/24/2021   CREATININE 0.81 03/23/2021   CALCIUM 8.7 (L) 03/24/2021   CALCIUM 9.1 03/23/2021   LFT Recent Labs    03/23/21 0357 03/24/21 0133  PROT 7.8 6.1*  ALBUMIN 4.4 3.4*  AST 237* 160*  ALT 239* 262*  ALKPHOS 155* 153*  BILITOT 2.3* 2.1*  BILIDIR 1.0*  --   IBILI 1.3*  --    PT/INR No results found for: INR, PROTIME Hepatitis Panel No results for input(s): HEPBSAG, HCVAB, HEPAIGM, HEPBIGM in the last 72 hours. C-Diff No components found for: CDIFF Lipase     Component Value Date/Time   LIPASE 37 03/23/2021 0357    Drugs of Abuse  No results found for: LABOPIA, COCAINSCRNUR, LABBENZ, AMPHETMU, THCU, LABBARB   RADIOLOGY STUDIES: MR 3D Recon At Scanner  Result Date: 03/23/2021 CLINICAL DATA:  Epigastric and right upper quadrant abdominal pain since 8 p.m. last night. Cholelithiasis on ultrasound. EXAM: MRI ABDOMEN WITHOUT AND WITH CONTRAST (INCLUDING MRCP) TECHNIQUE: Multiplanar multisequence MR imaging of the abdomen was performed both before and after the administration of intravenous contrast. Heavily T2-weighted images of the biliary and pancreatic ducts were obtained, and three-dimensional MRCP images were rendered by post processing. CONTRAST:  68m GADAVIST GADOBUTROL 1 MMOL/ML IV SOLN COMPARISON:  03/23/2021 abdominal sonogram. 09/16/2009 CT  abdomen/pelvis. FINDINGS: Lower chest: No acute abnormality at the lung bases. Hepatobiliary: Normal liver size and configuration. No significant hepatic steatosis. Simple 1.5 cm inferior right liver cyst. No suspicious liver masses. Numerous gallstones layering within the nondistended gallbladder, largest 1.4 cm. No definite gallbladder wall thickening. No pericholecystic fluid. No biliary ductal dilatation. Common bile duct diameter 3 mm. There is a solitary a 3 mm stone in the lower third of the common bile duct (series 11/image 21). No additional biliary filling defects. No biliary beading or strictures. Pancreas: Tiny unilocular 0.5 cm cystic lesion in the anterior uncinate process of the pancreas (series 4/image 29) without wall thickening or solid enhancement. No additional pancreatic lesions. No pancreatic duct dilation. There is pancreas divisum. Spleen: Mild splenomegaly. Craniocaudal splenic length 13.1 cm. No splenic mass. Adrenals/Urinary Tract: Right adrenal 1.3 cm nodule with loss of signal intensity on out of phase chemical shift imaging compatible with an adenoma. No left adrenal nodules. No hydronephrosis. Multiple small simple renal cortical cysts in both kidneys, largest 2.0 cm in the upper right kidney. No suspicious renal masses. Stomach/Bowel: Normal non-distended stomach. Visualized small and large bowel is normal caliber, with no bowel wall thickening. Vascular/Lymphatic: Normal caliber abdominal aorta. Patent portal, splenic, hepatic and renal veins. Mildly enlarged porta hepatis nodes up to the 1.0 cm (series 23/image 35). Other: No abdominal ascites or focal fluid collection. Musculoskeletal: No aggressive appearing focal osseous lesions. Left T12 vertebral hemangioma. IMPRESSION: 1. Cholelithiasis. No MRI findings of acute cholecystitis. 2. Solitary 3 mm choledocholith in the lower third of the common bile duct. No biliary ductal dilatation. CBD diameter 3 mm. 3. Mild porta hepatis  lymphadenopathy, nonspecific. Mild splenomegaly. Suggest attention on follow-up CT abdomen/pelvis with oral and IV contrast in 3 months. 4. Pancreas divisum. 5. Small right adrenal adenoma. Electronically Signed   By: JIlona SorrelM.D.   On: 03/23/2021 08:31   MR ABDOMEN MRCP W WO CONTAST  Result Date: 03/23/2021 CLINICAL DATA:  Epigastric and right upper quadrant abdominal pain since 8 p.m. last night. Cholelithiasis on ultrasound. EXAM: MRI ABDOMEN WITHOUT AND WITH CONTRAST (INCLUDING MRCP) TECHNIQUE: Multiplanar multisequence  MR imaging of the abdomen was performed both before and after the administration of intravenous contrast. Heavily T2-weighted images of the biliary and pancreatic ducts were obtained, and three-dimensional MRCP images were rendered by post processing. CONTRAST:  77m GADAVIST GADOBUTROL 1 MMOL/ML IV SOLN COMPARISON:  03/23/2021 abdominal sonogram. 09/16/2009 CT abdomen/pelvis. FINDINGS: Lower chest: No acute abnormality at the lung bases. Hepatobiliary: Normal liver size and configuration. No significant hepatic steatosis. Simple 1.5 cm inferior right liver cyst. No suspicious liver masses. Numerous gallstones layering within the nondistended gallbladder, largest 1.4 cm. No definite gallbladder wall thickening. No pericholecystic fluid. No biliary ductal dilatation. Common bile duct diameter 3 mm. There is a solitary a 3 mm stone in the lower third of the common bile duct (series 11/image 21). No additional biliary filling defects. No biliary beading or strictures. Pancreas: Tiny unilocular 0.5 cm cystic lesion in the anterior uncinate process of the pancreas (series 4/image 29) without wall thickening or solid enhancement. No additional pancreatic lesions. No pancreatic duct dilation. There is pancreas divisum. Spleen: Mild splenomegaly. Craniocaudal splenic length 13.1 cm. No splenic mass. Adrenals/Urinary Tract: Right adrenal 1.3 cm nodule with loss of signal intensity on out of phase  chemical shift imaging compatible with an adenoma. No left adrenal nodules. No hydronephrosis. Multiple small simple renal cortical cysts in both kidneys, largest 2.0 cm in the upper right kidney. No suspicious renal masses. Stomach/Bowel: Normal non-distended stomach. Visualized small and large bowel is normal caliber, with no bowel wall thickening. Vascular/Lymphatic: Normal caliber abdominal aorta. Patent portal, splenic, hepatic and renal veins. Mildly enlarged porta hepatis nodes up to the 1.0 cm (series 23/image 35). Other: No abdominal ascites or focal fluid collection. Musculoskeletal: No aggressive appearing focal osseous lesions. Left T12 vertebral hemangioma. IMPRESSION: 1. Cholelithiasis. No MRI findings of acute cholecystitis. 2. Solitary 3 mm choledocholith in the lower third of the common bile duct. No biliary ductal dilatation. CBD diameter 3 mm. 3. Mild porta hepatis lymphadenopathy, nonspecific. Mild splenomegaly. Suggest attention on follow-up CT abdomen/pelvis with oral and IV contrast in 3 months. 4. Pancreas divisum. 5. Small right adrenal adenoma. Electronically Signed   By: JIlona SorrelM.D.   On: 03/23/2021 08:31   UK[place]Abdomen Limited RUQ (LIVER/GB)  Result Date: 03/23/2021 CLINICAL DATA:  45year old male with acute right upper quadrant pain since 2000 hours. EXAM: ULTRASOUND ABDOMEN LIMITED RIGHT UPPER QUADRANT COMPARISON:  CT Abdomen and Pelvis 09/16/2009. FINDINGS: Gallbladder: Gallstones in the gallbladder neck individually up to 12 mm (image 13). These appear to be mo bile on left lateral decubitus images (image 60). Gallbladder wall thickness is at the upper limits of normal, 2-3 mm. No pericholecystic fluid. No sonographic Murphy sign elicited. Common bile duct: Diameter: 5-6 mm, upper limits of normal. Liver: Echogenic liver (image 162). No intrahepatic biliary ductal dilatation. No discrete liver lesion. Portal vein is patent on color Doppler imaging with normal direction of  blood flow towards the liver. Other: Negative visible right kidney IMPRESSION: 1. Cholelithiasis. No strong evidence of acute cholecystitis or bile duct obstruction. 2. Fatty liver disease. Electronically Signed   By: HGenevie AnnM.D.   On: 03/23/2021 05:18    IMPRESSION:   *   Biliary colic.  Elevated LFTs.  Cholelithiasis without cholecystitis. Choledocholithiasis.  *    Fatty liver.  Morbid obesity.    PLAN:     *   ERCP timing TBD. Pt did not receive the Lovenox which is ordered to be given every evening at 2200. According  to the surgical note from this morning, there is a possibility that pt may go to lap chole this afternoon if ERCP cannot be arranged today.      Azucena Freed  03/24/2021, 9:55 AM Phone [phone]  I have reviewed the entire case in detail with the above APP and discussed the plan in detail.  Therefore, I agree with the diagnoses recorded above. In addition,  I have personally interviewed and examined the patient, and both his wife and the surgical PA were at the bedside during my visit.  My additional thoughts are as follows:  Months of right upper quadrant pain, now diagnosed as biliary colic and choledocholithiasis. We were asked to see this patient regarding management of choledocholithiasis and timing of ERCP. The current advanced endoscopist and endoscopy department schedule will not allow a nonemergent ERCP today at [facility].  Fortunately, this patient is not showing signs of cholecystitis or cholangitis.   My recommendation is for him to undergo a preop ERCP for ductal clearance, and we currently have him on the schedule for tomorrow afternoon with Dr. Oretha Caprice.  I conveyed that plan to the surgical service. Procedure described in detail along with risks and benefits and he was agreeable.  The benefits and risks of the planned procedure were described in detail with the patient or (when appropriate) their health care proxy.  Risks were outlined as  including, but not limited to, bleeding, infection, perforation, adverse medication reaction leading to cardiac or pulmonary decompensation, pancreatitis (if ERCP).  The limitation of incomplete mucosal visualization was also discussed.  No guarantees or warranties were given.  Patient at increased risk for cardiopulmonary complications of procedure due to medical comorbidities.  (BMI)    Nelida Meuse III Office:[phone]

## 2021-03-25 ENCOUNTER — Inpatient Hospital Stay (HOSPITAL_COMMUNITY): Payer: BC Managed Care – PPO | Admitting: Certified Registered Nurse Anesthetist

## 2021-03-25 ENCOUNTER — Encounter (HOSPITAL_COMMUNITY)
Admission: AD | Disposition: A | Discharge status: Home / Self Care | Payer: Self-pay | Source: Other Acute Inpatient Hospital

## 2021-03-25 ENCOUNTER — Inpatient Hospital Stay (HOSPITAL_COMMUNITY): Payer: BC Managed Care – PPO

## 2021-03-25 ENCOUNTER — Encounter (HOSPITAL_COMMUNITY): Payer: Self-pay

## 2021-03-25 HISTORY — PX: ENDOSCOPIC RETROGRADE CHOLANGIOPANCREATOGRAPHY (ERCP) WITH PROPOFOL: SHX5810

## 2021-03-25 HISTORY — PX: REMOVAL OF STONES: SHX5545

## 2021-03-25 HISTORY — PX: SPHINCTEROTOMY: SHX5544

## 2021-03-25 LAB — COMPREHENSIVE METABOLIC PANEL
ALT: 242 U/L — ABNORMAL HIGH (ref 0–44)
AST: 139 U/L — ABNORMAL HIGH (ref 15–41)
Albumin: 3.4 g/dL — ABNORMAL LOW (ref 3.5–5.0)
Alkaline Phosphatase: 155 U/L — ABNORMAL HIGH (ref 38–126)
Anion gap: 7 (ref 5–15)
BUN: 6 mg/dL (ref 6–20)
CO2: 27 mmol/L (ref 22–32)
Calcium: 8.7 mg/dL — ABNORMAL LOW (ref 8.9–10.3)
Chloride: 105 mmol/L (ref 98–111)
Creatinine, Ser: 0.85 mg/dL (ref 0.61–1.24)
GFR, Estimated: >60 mL/min (ref >60)
Glucose, Bld: 101 mg/dL — ABNORMAL HIGH (ref 70–99)
Potassium: 3.4 mmol/L — ABNORMAL LOW (ref 3.5–5.1)
Sodium: 139 mmol/L (ref 135–145)
Total Bilirubin: 1.9 mg/dL — ABNORMAL HIGH (ref 0.3–1.2)
Total Protein: 6.6 g/dL (ref 6.5–8.1)

## 2021-03-25 LAB — CBC
HCT: 41.1 % (ref 39.0–52.0)
Hemoglobin: 13.5 g/dL (ref 13.0–17.0)
MCH: 29.7 pg (ref 26.0–34.0)
MCHC: 32.8 g/dL (ref 30.0–36.0)
MCV: 90.3 fL (ref 80.0–100.0)
Platelets: 170 10*3/uL (ref 150–400)
RBC: 4.55 MIL/uL (ref 4.22–5.81)
RDW: 14.5 % (ref 11.5–15.5)
WBC: 5.6 10*3/uL (ref 4.0–10.5)
nRBC: 0 % (ref 0.0–0.2)

## 2021-03-25 LAB — SURGICAL PCR SCREEN
MRSA, PCR: NEGATIVE
Staphylococcus aureus: NEGATIVE

## 2021-03-25 SURGERY — ENDOSCOPIC RETROGRADE CHOLANGIOPANCREATOGRAPHY (ERCP) WITH PROPOFOL
Anesthesia: General

## 2021-03-25 MED ORDER — INDOMETHACIN 50 MG RE SUPP
RECTAL | Status: AC
  Filled 2021-03-25: qty 2

## 2021-03-25 MED ORDER — INDOMETHACIN 50 MG RE SUPP
RECTAL | Status: DC | PRN
  Administered 2021-03-25: 100 mg via RECTAL

## 2021-03-25 MED ORDER — LACTATED RINGERS IV SOLN: INTRAVENOUS | Status: DC | PRN

## 2021-03-25 MED ORDER — ONDANSETRON HCL 4 MG/2ML IJ SOLN
INTRAMUSCULAR | Status: DC | PRN
  Administered 2021-03-25: 4 mg via INTRAVENOUS

## 2021-03-25 MED ORDER — SODIUM CHLORIDE 0.9 % IV SOLN: 3.0000 g | Freq: Once | INTRAVENOUS | Status: DC

## 2021-03-25 MED ORDER — SODIUM CHLORIDE 0.9 % IV SOLN
INTRAVENOUS | Status: DC | PRN
  Administered 2021-03-25: 30 mL

## 2021-03-25 MED ORDER — DEXAMETHASONE SODIUM PHOSPHATE 10 MG/ML IJ SOLN
INTRAMUSCULAR | Status: DC | PRN
  Administered 2021-03-25: 5 mg via INTRAVENOUS

## 2021-03-25 MED ORDER — INDOMETHACIN 50 MG RE SUPP: 100.0000 mg | Freq: Once | RECTAL | Status: DC

## 2021-03-25 MED ORDER — LIDOCAINE 2% (20 MG/ML) 5 ML SYRINGE
INTRAMUSCULAR | Status: DC | PRN
  Administered 2021-03-25: 100 mg via INTRAVENOUS

## 2021-03-25 MED ORDER — FENTANYL CITRATE (PF) 250 MCG/5ML IJ SOLN
INTRAMUSCULAR | Status: DC | PRN
  Administered 2021-03-25: 100 ug via INTRAVENOUS

## 2021-03-25 MED ORDER — GLUCAGON HCL RDNA (DIAGNOSTIC) 1 MG IJ SOLR
INTRAMUSCULAR | Status: AC
  Filled 2021-03-25: qty 1

## 2021-03-25 MED ORDER — SUGAMMADEX SODIUM 200 MG/2ML IV SOLN
INTRAVENOUS | Status: DC | PRN
  Administered 2021-03-25: 400 mg via INTRAVENOUS

## 2021-03-25 MED ORDER — PROPOFOL 10 MG/ML IV BOLUS
INTRAVENOUS | Status: DC | PRN
  Administered 2021-03-25: 200 mg via INTRAVENOUS

## 2021-03-25 MED ORDER — FAMOTIDINE 10 MG PO TABS
10.0000 mg | ORAL_TABLET | Freq: Every day | ORAL | Status: DC
  Administered 2021-03-25 – 2021-03-27 (×3): 10 mg via ORAL
  Filled 2021-03-25 (×3): qty 1

## 2021-03-25 MED ORDER — ROCURONIUM BROMIDE 10 MG/ML (PF) SYRINGE
PREFILLED_SYRINGE | INTRAVENOUS | Status: DC | PRN
  Administered 2021-03-25: 60 mg via INTRAVENOUS

## 2021-03-25 NOTE — Anesthesia Procedure Notes (Signed)
Procedure Name: Intubation Date/Time: 03/25/2021 1:32 PM Performed by: Dorthea Cove, CRNA Pre-anesthesia Checklist: Patient identified, Emergency Drugs available, Suction available and Patient being monitored Patient Re-evaluated:Patient Re-evaluated prior to induction Oxygen Delivery Method: Circle system utilized Preoxygenation: Pre-oxygenation with 100% oxygen Induction Type: IV induction Ventilation: Mask ventilation without difficulty and Oral airway inserted - appropriate to patient size Laryngoscope Size: Mac and 4 Grade View: Grade II Tube type: Oral Number of attempts: 1 Airway Equipment and Method: Stylet and Oral airway Placement Confirmation: ETT inserted through vocal cords under direct vision,  positive ETCO2 and breath sounds checked- equal and bilateral Secured at: 23 cm Tube secured with: Tape Dental Injury: Teeth and Oropharynx as per pre-operative assessment

## 2021-03-25 NOTE — Transfer of Care (Signed)
Immediate Anesthesia Transfer of Care Note  Patient: Thomas Thompson  Procedure(s) Performed: ENDOSCOPIC RETROGRADE CHOLANGIOPANCREATOGRAPHY (ERCP) WITH PROPOFOL (N/A ) SPHINCTEROTOMY REMOVAL OF STONES  Patient Location: Endoscopy Unit  Anesthesia Type:General  Level of Consciousness: awake, alert  and oriented  Airway & Oxygen Therapy: Patient Spontanous Breathing and Patient connected to nasal cannula oxygen  Post-op Assessment: Report given to RN and Post -op Vital signs reviewed and stable  Post vital signs: Reviewed and stable  Last Vitals:  Vitals Value Taken Time  BP 107/34 03/25/21 1433  Temp    Pulse 65 03/25/21 1435  Resp 18 03/25/21 1435  SpO2 92 % 03/25/21 1435  Vitals shown include unvalidated device data.  Last Pain:  Vitals:   03/25/21 1211  TempSrc: Oral  PainSc: 0-No pain      Patients Stated Pain Goal: 3 (03/25/21 2446)  Complications: No complications documented.

## 2021-03-25 NOTE — Anesthesia Preprocedure Evaluation (Addendum)
Anesthesia Evaluation  Patient identified by MRN, date of birth, ID band Patient awake    Reviewed: Allergy & Precautions, NPO status , Patient's Chart, lab work & pertinent test results  History of Anesthesia Complications Negative for: history of anesthetic complications  Airway Mallampati: I  TM Distance: >3 FB Neck ROM: Full    Dental no notable dental hx. (+) Teeth Intact, Dental Advisory Given   Pulmonary    Pulmonary exam normal breath sounds clear to auscultation       Cardiovascular Normal cardiovascular exam Rhythm:Regular Rate:Normal     Neuro/Psych    GI/Hepatic GERD  Controlled and Medicated,Common bile duct stone.   Endo/Other  Morbid obesity  Renal/GU   negative genitourinary   Musculoskeletal   Abdominal   Peds  Hematology   Anesthesia Other Findings Day of surgery medications reviewed with patient.  Reproductive/Obstetrics                            Anesthesia Physical  Anesthesia Plan  ASA: III  Anesthesia Plan: General   Post-op Pain Management:    Induction: Intravenous  PONV Risk Score and Plan: 2 and Treatment may vary due to age or medical condition, Ondansetron, Dexamethasone and Midazolam  Airway Management Planned: Oral ETT  Additional Equipment:   Intra-op Plan:   Post-operative Plan: Extubation in OR  Informed Consent: I have reviewed the patients History and Physical, chart, labs and discussed the procedure including the risks, benefits and alternatives for the proposed anesthesia with the patient or authorized representative who has indicated his/her understanding and acceptance.     Dental advisory given  Plan Discussed with: CRNA and Anesthesiologist  Anesthesia Plan Comments:         Anesthesia Quick Evaluation

## 2021-03-25 NOTE — Anesthesia Preprocedure Evaluation (Addendum)
Anesthesia Evaluation  Patient identified by MRN, date of birth, ID band Patient awake    Reviewed: Allergy & Precautions, NPO status , Patient's Chart, lab work & pertinent test results  History of Anesthesia Complications Negative for: history of anesthetic complications  Airway Mallampati: II  TM Distance: >3 FB Neck ROM: Full    Dental no notable dental hx.    Pulmonary neg pulmonary ROS,    Pulmonary exam normal        Cardiovascular negative cardio ROS Normal cardiovascular exam     Neuro/Psych negative neurological ROS  negative psych ROS   GI/Hepatic Neg liver ROS, GERD  Controlled and Medicated,Common bile duct stone.   Endo/Other  Morbid obesity  Renal/GU negative Renal ROS  negative genitourinary   Musculoskeletal negative musculoskeletal ROS (+)   Abdominal   Peds  Hematology negative hematology ROS (+)   Anesthesia Other Findings Day of surgery medications reviewed with patient.  Reproductive/Obstetrics negative OB ROS                            Anesthesia Physical Anesthesia Plan  ASA: III  Anesthesia Plan: General   Post-op Pain Management:    Induction: Intravenous  PONV Risk Score and Plan: 2 and Treatment may vary due to age or medical condition, Ondansetron, Dexamethasone and Midazolam  Airway Management Planned: Oral ETT  Additional Equipment:   Intra-op Plan:   Post-operative Plan: Extubation in OR  Informed Consent: I have reviewed the patients History and Physical, chart, labs and discussed the procedure including the risks, benefits and alternatives for the proposed anesthesia with the patient or authorized representative who has indicated his/her understanding and acceptance.     Dental advisory given  Plan Discussed with: CRNA  Anesthesia Plan Comments:        Anesthesia Quick Evaluation

## 2021-03-25 NOTE — Progress Notes (Signed)
   Progress Note     Subjective: Patient reports severe pain overnight, felt exactly the same as initial pain when he presented to the hospital. He is feeling better this AM. Denies nausea or vomiting. VSS.   Objective: Vital signs in last 24 hours: Temp:  [97.7 F (36.5 C)-98.2 F (36.8 C)] 98.2 F (36.8 C) (04/05 0457) Pulse Rate:  [48-69] 58 (04/05 0457) Resp:  [17-20] 19 (04/05 0457) BP: (107-119)/(63-74) 119/66 (04/05 0457) SpO2:  [97 %-98 %] 98 % (04/05 0457) Last BM Date: 03/24/21  Intake/Output from previous day: 04/04 0701 - 04/05 0700 In: 2306.8 [P.O.:250; I.V.:1960.6; IV Piggyback:96.3] Out: -  Intake/Output this shift: No intake/output data recorded.  PE: General: pleasant, WD, obese male who is laying in bed in NAD HEENT: Sclera are anicteric.  Heart: regular, rate, and rhythm.   Lungs: CTAB, no wheezes, rhonchi, or rales noted.  Respiratory effort nonlabored Abd: soft, NT, ND, +BS, no masses, hernias, or organomegaly MS: all 4 extremities are symmetrical with no cyanosis, clubbing, or edema. Skin: warm and dry with no masses, lesions, or rashes Neuro: Cranial nerves 2-12 grossly intact, sensation is normal throughout Psych: A&Ox3 with an appropriate affect.    Lab Results:  Recent Labs    03/24/21 0133 03/25/21 0453  WBC 4.0 5.6  HGB 13.4 13.5  HCT 40.5 41.1  PLT 158 170   BMET Recent Labs    03/24/21 0133 03/25/21 0453  NA 137 139  K 3.5 3.4*  CL 106 105  CO2 25 27  GLUCOSE 89 101*  BUN 8 6  CREATININE 0.80 0.85  CALCIUM 8.7* 8.7*   PT/INR No results for input(s): LABPROT, INR in the last 72 hours. CMP     Component Value Date/Time   NA 139 03/25/2021 0453   K 3.4 (L) 03/25/2021 0453   CL 105 03/25/2021 0453   CO2 27 03/25/2021 0453   GLUCOSE 101 (H) 03/25/2021 0453   BUN 6 03/25/2021 0453   CREATININE 0.85 03/25/2021 0453   CALCIUM 8.7 (L) 03/25/2021 0453   PROT 6.6 03/25/2021 0453   ALBUMIN 3.4 (L) 03/25/2021 0453   AST 139  (H) 03/25/2021 0453   ALT 242 (H) 03/25/2021 0453   ALKPHOS 155 (H) 03/25/2021 0453   BILITOT 1.9 (H) 03/25/2021 0453   GFRNONAA >60 03/25/2021 0453   Lipase     Component Value Date/Time   LIPASE 37 03/23/2021 0357       Studies/Results: No results found.  Anti-infectives: Anti-infectives (From admission, onward)   Start     Dose/Rate Route Frequency Ordered Stop   03/24/21 1000  cefTRIAXone (ROCEPHIN) 2 g in sodium chloride 0.9 % 100 mL IVPB        2 g 200 mL/hr over 30 Minutes Intravenous Every 24 hours 03/23/21 1926         Assessment/Plan Morbid Obesity - BMI 41.09  Choledocholithiasis - Tbili 1.9, AST/ALT trending down - no leukocytosis, worsened pain overnight but is better this AM so far - MRCP with 3 mm stone in CBD - GI planning ERCP today - possible OR tomorrow pending ERCP findings and OR availability   FEN: NPO, IVF VTE: lovenox ID: rocephin 4/4  LOS: 2 days    Juliet Rude , [facility] 03/25/2021, 8:44 AM Please see Amion for pager number during day hours 7:00am-4:30pm

## 2021-03-25 NOTE — Interval H&P Note (Signed)
History and Physical Interval Note:  03/25/2021 1:22 PM  Thomas Thompson  has presented today for surgery, with the diagnosis of Common bile duct stone..  The various methods of treatment have been discussed with the patient and family. After consideration of risks, benefits and other options for treatment, the patient has consented to  Procedure(s): ENDOSCOPIC RETROGRADE CHOLANGIOPANCREATOGRAPHY (ERCP) WITH PROPOFOL (N/A) as a surgical intervention.  The patient's history has been reviewed, patient examined, no change in status, stable for surgery.  I have reviewed the patient's chart and labs.  Questions were answered to the patient's satisfaction.     Rachael Fee

## 2021-03-25 NOTE — Op Note (Addendum)
[facility] Patient Name: Thomas Thompson Procedure Date : 03/25/2021 MRN: [id number] Attending MD: Rachael Fee , MD Date of Birth: [birth year] CSN: [id number] Age: 45 Admit Type: Inpatient Procedure:                ERCP Indications:              Abdominal pain, elevated liver tests, imaging                            showing stones in GB and also a stone in the CBD Providers:                Rachael Fee, MD, Dayton Bailiff, RN, Arlee Muslim Tech., Technician Referring MD:              Medicines:                General Anesthesia, Indomethacin 100 mg PR, Ancef                            IV this morning Complications:            No immediate complications. Estimated blood loss:                            None Estimated Blood Loss:     Estimated blood loss: none. Procedure:                Pre-Anesthesia Assessment:                           - Prior to the procedure, a History and Physical                            was performed, and patient medications and                            allergies were reviewed. The patient's tolerance of                            previous anesthesia was also reviewed. The risks                            and benefits of the procedure and the sedation                            options and risks were discussed with the patient.                            All questions were answered, and informed consent                            was obtained. Prior Anticoagulants: The patient has  taken no previous anticoagulant or antiplatelet                            agents. ASA Grade Assessment: II - A patient with                            mild systemic disease. After reviewing the risks                            and benefits, the patient was deemed in                            satisfactory condition to undergo the procedure.                           After obtaining informed consent,  the scope was                            passed under direct vision. Throughout the                            procedure, the patient's blood pressure, pulse, and                            oxygen saturations were monitored continuously. The                            TJF- Q180V ([id number]) Olympus duodenoscope was                            introduced through the mouth, and used to inject                            contrast into and used to inject contrast into the                            bile duct. The ERCP was accomplished without                            difficulty. The patient tolerated the procedure                            well. Scope In: Scope Out: Findings:      The duodenoscope was advanced to the region of the major papilla without       detailed examination of the upper GI tract. The major papilla was       normal. I used a 44 autotome over a 0.035 Hydra wire to cannulate the       bile duct. Contrast was injected and cholangiogram revealed nondilated       extrahepatic biliary tree. There was an apparent small mobile filling       defect the distal CBD (stone vs air bubble). The cystic duct partially       opacified as did the gallbladder. I performed an adequate biliary  sphincterotomy over the wire and then I used a 9 to 12 mm retrieval       balloon inflated to 9 mm to sweep the bile duct several times. No stones       or stone debris were delivered into the duodenum. I then performed a       completion, occlusion cholangiogram and it showed no further filling       defects. The main pancreatic duct was never cannulated with a wire or       injected with dye. Impression:               - ERCP with biliary sphincterotomy and balloon                            sweeping revealed no bile duct stones. Initially                            noted filling defects were probably small air                            bubbles. I suspect that he passed the 57mm MRI noted                             CBD stone last night during his acute pain episode. Recommendation:           - Return patient to hospital ward for ongoing care.                           - I will allow solid food tonight and order NPO                            after MN for likely cholecystectomy tomorrow.                           - Please call or page with any further questions or                            concerns. Procedure Code(s):        --- Professional ---                           [phone], Endoscopic retrograde                            cholangiopancreatography (ERCP); with removal of                            calculi/debris from biliary/pancreatic duct(s)                           43262, Endoscopic retrograde                            cholangiopancreatography (ERCP); with  sphincterotomy/papillotomy Diagnosis Code(s):        --- Professional ---                           K80.50, Calculus of bile duct without cholangitis                            or cholecystitis without obstruction CPT copyright 2019 [employer]. All rights reserved. The codes documented in this report are preliminary and upon coder review may  be revised to meet current compliance requirements. Rachael Fee, MD 03/25/2021 2:21:06 PM This report has been signed electronically. Number of Addenda: 0

## 2021-03-26 ENCOUNTER — Inpatient Hospital Stay (HOSPITAL_COMMUNITY): Payer: BC Managed Care – PPO | Admitting: Anesthesiology

## 2021-03-26 ENCOUNTER — Encounter (HOSPITAL_COMMUNITY): Payer: Self-pay | Admitting: Gastroenterology

## 2021-03-26 ENCOUNTER — Encounter (HOSPITAL_COMMUNITY)
Admission: AD | Disposition: A | Discharge status: Home / Self Care | Payer: Self-pay | Source: Other Acute Inpatient Hospital

## 2021-03-26 HISTORY — PX: CHOLECYSTECTOMY: SHX55

## 2021-03-26 LAB — COMPREHENSIVE METABOLIC PANEL
ALT: 222 U/L — ABNORMAL HIGH (ref 0–44)
AST: 85 U/L — ABNORMAL HIGH (ref 15–41)
Albumin: 3.7 g/dL (ref 3.5–5.0)
Alkaline Phosphatase: 159 U/L — ABNORMAL HIGH (ref 38–126)
Anion gap: 6 (ref 5–15)
BUN: 7 mg/dL (ref 6–20)
CO2: 22 mmol/L (ref 22–32)
Calcium: 9 mg/dL (ref 8.9–10.3)
Chloride: 109 mmol/L (ref 98–111)
Creatinine, Ser: 0.84 mg/dL (ref 0.61–1.24)
GFR, Estimated: >60 mL/min (ref >60)
Glucose, Bld: 173 mg/dL — ABNORMAL HIGH (ref 70–99)
Potassium: 3.8 mmol/L (ref 3.5–5.1)
Sodium: 137 mmol/L (ref 135–145)
Total Bilirubin: 0.9 mg/dL (ref 0.3–1.2)
Total Protein: 6.9 g/dL (ref 6.5–8.1)

## 2021-03-26 LAB — CBC
HCT: 42.2 % (ref 39.0–52.0)
Hemoglobin: 13.8 g/dL (ref 13.0–17.0)
MCH: 30.1 pg (ref 26.0–34.0)
MCHC: 32.7 g/dL (ref 30.0–36.0)
MCV: 92.1 fL (ref 80.0–100.0)
Platelets: 178 10*3/uL (ref 150–400)
RBC: 4.58 MIL/uL (ref 4.22–5.81)
RDW: 14.4 % (ref 11.5–15.5)
WBC: 5.9 10*3/uL (ref 4.0–10.5)
nRBC: 0 % (ref 0.0–0.2)

## 2021-03-26 SURGERY — LAPAROSCOPIC CHOLECYSTECTOMY
Anesthesia: General | Site: Abdomen

## 2021-03-26 MED ORDER — OXYCODONE HCL 5 MG PO TABS: 5.0000 mg | ORAL_TABLET | Freq: Once | ORAL | Status: DC | PRN

## 2021-03-26 MED ORDER — PROPOFOL 10 MG/ML IV BOLUS
INTRAVENOUS | Status: AC
  Filled 2021-03-26: qty 20

## 2021-03-26 MED ORDER — FENTANYL CITRATE (PF) 100 MCG/2ML IJ SOLN: 25.0000 ug | INTRAMUSCULAR | Status: DC | PRN

## 2021-03-26 MED ORDER — SUGAMMADEX SODIUM 200 MG/2ML IV SOLN
INTRAVENOUS | Status: DC | PRN
  Administered 2021-03-26: 400 mg via INTRAVENOUS

## 2021-03-26 MED ORDER — BUPIVACAINE HCL 0.25 % IJ SOLN
INTRAMUSCULAR | Status: DC | PRN
  Administered 2021-03-26: 20 mL

## 2021-03-26 MED ORDER — DEXAMETHASONE SODIUM PHOSPHATE 10 MG/ML IJ SOLN
INTRAMUSCULAR | Status: DC | PRN
  Administered 2021-03-26: 10 mg via INTRAVENOUS

## 2021-03-26 MED ORDER — BUPIVACAINE HCL (PF) 0.25 % IJ SOLN
INTRAMUSCULAR | Status: AC
  Filled 2021-03-26: qty 30

## 2021-03-26 MED ORDER — ROCURONIUM BROMIDE 10 MG/ML (PF) SYRINGE
PREFILLED_SYRINGE | INTRAVENOUS | Status: AC
  Filled 2021-03-26: qty 10

## 2021-03-26 MED ORDER — MIDAZOLAM HCL 5 MG/5ML IJ SOLN
INTRAMUSCULAR | Status: DC | PRN
  Administered 2021-03-26: 2 mg via INTRAVENOUS

## 2021-03-26 MED ORDER — MIDAZOLAM HCL 2 MG/2ML IJ SOLN
INTRAMUSCULAR | Status: AC
  Filled 2021-03-26: qty 2

## 2021-03-26 MED ORDER — ONDANSETRON HCL 4 MG/2ML IJ SOLN
INTRAMUSCULAR | Status: DC | PRN
  Administered 2021-03-26: 4 mg via INTRAVENOUS

## 2021-03-26 MED ORDER — LACTATED RINGERS IV SOLN: INTRAVENOUS | Status: DC | PRN

## 2021-03-26 MED ORDER — LIDOCAINE 2% (20 MG/ML) 5 ML SYRINGE
INTRAMUSCULAR | Status: AC
  Filled 2021-03-26: qty 5

## 2021-03-26 MED ORDER — FENTANYL CITRATE (PF) 250 MCG/5ML IJ SOLN
INTRAMUSCULAR | Status: AC
  Filled 2021-03-26: qty 5

## 2021-03-26 MED ORDER — 0.9 % SODIUM CHLORIDE (POUR BTL) OPTIME
TOPICAL | Status: DC | PRN
  Administered 2021-03-26: 1000 mL

## 2021-03-26 MED ORDER — DEXAMETHASONE SODIUM PHOSPHATE 10 MG/ML IJ SOLN
INTRAMUSCULAR | Status: AC
  Filled 2021-03-26: qty 1

## 2021-03-26 MED ORDER — MEPERIDINE HCL 25 MG/ML IJ SOLN: 6.2500 mg | INTRAMUSCULAR | Status: DC | PRN

## 2021-03-26 MED ORDER — ONDANSETRON HCL 4 MG/2ML IJ SOLN
4.0000 mg | Freq: Once | INTRAMUSCULAR | Status: DC | PRN
Start: 2021-03-26 — End: 2021-03-26

## 2021-03-26 MED ORDER — FENTANYL CITRATE (PF) 100 MCG/2ML IJ SOLN
INTRAMUSCULAR | Status: DC | PRN
  Administered 2021-03-26: 50 ug via INTRAVENOUS
  Administered 2021-03-26: 100 ug via INTRAVENOUS
  Administered 2021-03-26: 50 ug via INTRAVENOUS

## 2021-03-26 MED ORDER — ACETAMINOPHEN 160 MG/5ML PO SOLN: 325.0000 mg | ORAL | Status: DC | PRN

## 2021-03-26 MED ORDER — OXYCODONE HCL 5 MG/5ML PO SOLN
5.0000 mg | Freq: Once | ORAL | Status: DC | PRN
Start: 2021-03-26 — End: 2021-03-26

## 2021-03-26 MED ORDER — ONDANSETRON HCL 4 MG/2ML IJ SOLN
INTRAMUSCULAR | Status: AC
  Filled 2021-03-26: qty 2

## 2021-03-26 MED ORDER — PROMETHAZINE HCL 25 MG/ML IJ SOLN: 6.2500 mg | INTRAMUSCULAR | Status: DC | PRN

## 2021-03-26 MED ORDER — ROCURONIUM BROMIDE 10 MG/ML (PF) SYRINGE
PREFILLED_SYRINGE | INTRAVENOUS | Status: DC | PRN
  Administered 2021-03-26: 50 mg via INTRAVENOUS
  Administered 2021-03-26 (×2): 20 mg via INTRAVENOUS

## 2021-03-26 MED ORDER — LIDOCAINE 2% (20 MG/ML) 5 ML SYRINGE
INTRAMUSCULAR | Status: DC | PRN
  Administered 2021-03-26: 80 mg via INTRAVENOUS

## 2021-03-26 MED ORDER — SODIUM CHLORIDE 0.9 % IR SOLN
Status: DC | PRN
  Administered 2021-03-26: 1000 mL

## 2021-03-26 MED ORDER — PHENYLEPHRINE 40 MCG/ML (10ML) SYRINGE FOR IV PUSH (FOR BLOOD PRESSURE SUPPORT)
PREFILLED_SYRINGE | INTRAVENOUS | Status: AC
  Filled 2021-03-26: qty 10

## 2021-03-26 MED ORDER — PROPOFOL 10 MG/ML IV BOLUS
INTRAVENOUS | Status: DC | PRN
  Administered 2021-03-26: 200 mg via INTRAVENOUS

## 2021-03-26 MED ORDER — ACETAMINOPHEN 325 MG PO TABS: 325.0000 mg | ORAL_TABLET | ORAL | Status: DC | PRN

## 2021-03-26 SURGICAL SUPPLY — 43 items
ADH SKN CLS APL DERMABOND .7 (GAUZE/BANDAGES/DRESSINGS) ×1
APL PRP STRL LF DISP 70% ISPRP (MISCELLANEOUS) ×1
APPLIER CLIP 5 13 M/L LIGAMAX5 (MISCELLANEOUS) ×2
APR CLP MED LRG 5 ANG JAW (MISCELLANEOUS) ×1
BAG SPEC RTRVL LRG 6X4 10 (ENDOMECHANICALS) ×1
BLADE CLIPPER SURG (BLADE) ×1 IMPLANT
CANISTER SUCT 3000ML PPV (MISCELLANEOUS) ×2 IMPLANT
CHLORAPREP W/TINT 26 (MISCELLANEOUS) ×2 IMPLANT
CLIP APPLIE 5 13 M/L LIGAMAX5 (MISCELLANEOUS) ×1 IMPLANT
COVER SURGICAL LIGHT HANDLE (MISCELLANEOUS) ×2 IMPLANT
CUTTER FLEX LINEAR 45M (STAPLE) ×1 IMPLANT
DERMABOND ADVANCED (GAUZE/BANDAGES/DRESSINGS) ×1
DERMABOND ADVANCED .7 DNX12 (GAUZE/BANDAGES/DRESSINGS) ×1 IMPLANT
DISSECTOR BLUNT TIP ENDO 5MM (MISCELLANEOUS) IMPLANT
ELECT REM PT RETURN 9FT ADLT (ELECTROSURGICAL) ×2
ELECTRODE REM PT RTRN 9FT ADLT (ELECTROSURGICAL) ×1 IMPLANT
GLOVE BIO SURGEON STRL SZ7.5 (GLOVE) ×2 IMPLANT
GLOVE INDICATOR 8.0 STRL GRN (GLOVE) ×2 IMPLANT
GOWN STRL REUS W/ TWL LRG LVL3 (GOWN DISPOSABLE) ×2 IMPLANT
GOWN STRL REUS W/ TWL XL LVL3 (GOWN DISPOSABLE) ×1 IMPLANT
GOWN STRL REUS W/TWL LRG LVL3 (GOWN DISPOSABLE) ×4
GOWN STRL REUS W/TWL XL LVL3 (GOWN DISPOSABLE) ×2
KIT BASIN OR (CUSTOM PROCEDURE TRAY) ×2 IMPLANT
KIT TURNOVER KIT B (KITS) ×2 IMPLANT
L-HOOK LAP DISP 36CM (ELECTROSURGICAL) ×2
LHOOK LAP DISP 36CM (ELECTROSURGICAL) ×1 IMPLANT
NS IRRIG 1000ML POUR BTL (IV SOLUTION) ×2 IMPLANT
PAD ARMBOARD 7.5X6 YLW CONV (MISCELLANEOUS) ×2 IMPLANT
PENCIL BUTTON HOLSTER BLD 10FT (ELECTRODE) ×2 IMPLANT
POUCH SPECIMEN RETRIEVAL 10MM (ENDOMECHANICALS) ×1 IMPLANT
RELOAD STAPLE 45 3.5 BLU ETS (ENDOMECHANICALS) IMPLANT
RELOAD STAPLE TA45 3.5 REG BLU (ENDOMECHANICALS) ×2 IMPLANT
SCISSORS LAP 5X35 DISP (ENDOMECHANICALS) ×2 IMPLANT
SET IRRIG TUBING LAPAROSCOPIC (IRRIGATION / IRRIGATOR) ×2 IMPLANT
SET TUBE SMOKE EVAC HIGH FLOW (TUBING) ×2 IMPLANT
SPECIMEN JAR SMALL (MISCELLANEOUS) ×2 IMPLANT
SUT MNCRL AB 4-0 PS2 18 (SUTURE) ×2 IMPLANT
TOWEL GREEN STERILE (TOWEL DISPOSABLE) ×2 IMPLANT
TOWEL GREEN STERILE FF (TOWEL DISPOSABLE) ×2 IMPLANT
TRAY LAPAROSCOPIC MC (CUSTOM PROCEDURE TRAY) ×2 IMPLANT
TROCAR ADV FIXATION 5X100MM (TROCAR) ×6 IMPLANT
TROCAR XCEL BLUNT TIP 100MML (ENDOMECHANICALS) ×2 IMPLANT
WATER STERILE IRR 1000ML POUR (IV SOLUTION) ×2 IMPLANT

## 2021-03-26 NOTE — Discharge Instructions (Signed)
[facility] LAPAROSCOPIC SURGERY: POST OP INSTRUCTIONS Always review your discharge instruction sheet given to you by the facility where your surgery was performed. IF YOU HAVE DISABILITY OR FAMILY LEAVE FORMS, YOU MUST BRING THEM TO THE OFFICE FOR PROCESSING.   DO NOT GIVE THEM TO YOUR DOCTOR.  PAIN CONTROL  1. First take acetaminophen (Tylenol) AND/or ibuprofen (Advil) to control your pain after surgery.  Follow directions on package.  Taking acetaminophen (Tylenol) and/or ibuprofen (Advil) regularly after surgery will help to control your pain and lower the amount of prescription pain medication you may need.  You should not take more than 3,000 mg (3 grams) of acetaminophen (Tylenol) in 24 hours.  You should not take ibuprofen (Advil), aleve, motrin, naprosyn or other NSAIDS if you have a history of stomach ulcers or chronic kidney disease.  2. A prescription for pain medication may be given to you upon discharge.  Take your pain medication as prescribed, if you still have uncontrolled pain after taking acetaminophen (Tylenol) or ibuprofen (Advil). 3. Use ice packs to help control pain. 4. If you need a refill on your pain medication, please contact your pharmacy.  They will contact our office to request authorization. Prescriptions will not be filled after 5pm or on week-ends.  HOME MEDICATIONS 5. Take your usually prescribed medications unless otherwise directed.  DIET 6. You should follow a light diet the first few days after arrival home.  Be sure to include lots of fluids daily. Avoid fatty, fried foods.   CONSTIPATION 7. It is common to experience some constipation after surgery and if you are taking pain medication.  Increasing fluid intake and taking a stool softener (such as Colace) will usually help or prevent this problem from occurring.  A mild laxative (Milk of Magnesia or Miralax) should be taken according to package instructions if there are no bowel  movements after 48 hours.  WOUND/INCISION CARE 8. Most patients will experience some swelling and bruising in the area of the incisions.  Ice packs will help.  Swelling and bruising can take several days to resolve.  9. Unless discharge instructions indicate otherwise, follow guidelines below  a. STERI-STRIPS - you may remove your outer bandages 48 hours after surgery, and you may shower at that time.  You have steri-strips (small skin tapes) in place directly over the incision.  These strips should be left on the skin for 7-10 days.   b. DERMABOND/SKIN GLUE - you may shower in 24 hours.  The glue will flake off over the next 2-3 weeks. 10. Any sutures or staples will be removed at the office during your follow-up visit.  ACTIVITIES 11. You may resume regular (light) daily activities beginning the next day--such as daily self-care, walking, climbing stairs--gradually increasing activities as tolerated.  You may have sexual intercourse when it is comfortable.  Refrain from any heavy lifting or straining until approved by your doctor. a. You may drive when you are no longer taking prescription pain medication, you can comfortably wear a seatbelt, and you can safely maneuver your car and apply brakes.  FOLLOW-UP 12. You should see your doctor in the office for a follow-up appointment approximately 2-3 weeks after your surgery.  You should have been given your post-op/follow-up appointment when your surgery was scheduled.  If you did not receive a post-op/follow-up appointment, make sure that you call for this appointment within a day or two after you arrive home to insure a convenient appointment time.     WHEN TO CALL YOUR DOCTOR: 1. Fever over 101.0 2. Inability to urinate 3. Continued bleeding from incision. 4. Increased pain, redness, or drainage from the incision. 5. Increasing abdominal pain  The clinic staff is available to answer your questions during regular business hours.  Please don't  hesitate to call and ask to speak to one of the nurses for clinical concerns.  If you have a medical emergency, go to the nearest emergency room or call 911.  A surgeon from [facility] is always on call at the hospital. [address], [address], [place], [place]  [address] ? [address] [address], [place], [place]   [address] [phone] ? [phone] ? FAX [phone] Web site: [email or web address]  .........   Managing Your Pain After Surgery Without Opioids    Thank you for participating in our program to help patients manage their pain after surgery without opioids. This is part of our effort to provide you with the best care possible, without exposing you or your family to the risk that opioids pose.  What pain can I expect after surgery? You can expect to have some pain after surgery. This is normal. The pain is typically worse the day after surgery, and quickly begins to get better. Many studies have found that many patients are able to manage their pain after surgery with Over-the-Counter (OTC) medications such as Tylenol and Motrin. If you have a condition that does not allow you to take Tylenol or Motrin, notify your surgical team.  How will I manage my pain? The best strategy for controlling your pain after surgery is around the clock pain control with Tylenol (acetaminophen) and Motrin (ibuprofen or Advil). Alternating these medications with each other allows you to maximize your pain control. In addition to Tylenol and Motrin, you can use heating pads or ice packs on your incisions to help reduce your pain.  How will I alternate your regular strength over-the-counter pain medication? You will take a dose of pain medication every three hours. ; Start by taking 650 mg of Tylenol (2 pills of 325 mg) ; 3 hours later take 600 mg of Motrin (3 pills of 200 mg) ; 3 hours after taking the Motrin take 650 mg of Tylenol ; 3 hours after that take 600 mg of  Motrin.   - 1 -  See example - if your first dose of Tylenol is at 12:00 PM   12:00 PM Tylenol 650 mg (2 pills of 325 mg)  3:00 PM Motrin 600 mg (3 pills of 200 mg)  6:00 PM Tylenol 650 mg (2 pills of 325 mg)  9:00 PM Motrin 600 mg (3 pills of 200 mg)  Continue alternating every 3 hours   We recommend that you follow this schedule around-the-clock for at least 3 days after surgery, or until you feel that it is no longer needed. Use the table on the last page of this handout to keep track of the medications you are taking. Important: Do not take more than 3000mg of Tylenol or 3200mg of Motrin in a 24-hour period. Do not take ibuprofen/Motrin if you have a history of bleeding stomach ulcers, severe kidney disease, &/or actively taking a blood thinner  What if I still have pain? If you have pain that is not controlled with the over-the-counter pain medications (Tylenol and Motrin or Advil) you might have what we call "breakthrough" pain. You will receive a prescription for a small amount of an opioid pain medication such as   Oxycodone, Tramadol, or Tylenol with Codeine. Use these opioid pills in the first 24 hours after surgery if you have breakthrough pain. Do not take more than 1 pill every 4-6 hours.  If you still have uncontrolled pain after using all opioid pills, don't hesitate to call our staff using the number provided. We will help make sure you are managing your pain in the best way possible, and if necessary, we can provide a prescription for additional pain medication.   Day 1    Time  Name of Medication Number of pills taken  Amount of Acetaminophen  Pain Level   Comments  AM PM       AM PM       AM PM       AM PM       AM PM       AM PM       AM PM       AM PM       Total Daily amount of Acetaminophen Do not take more than  3,000 mg per day      Day 2    Time  Name of Medication Number of pills taken  Amount of Acetaminophen  Pain Level   Comments  AM  PM       AM PM       AM PM       AM PM       AM PM       AM PM       AM PM       AM PM       Total Daily amount of Acetaminophen Do not take more than  3,000 mg per day      Day 3    Time  Name of Medication Number of pills taken  Amount of Acetaminophen  Pain Level   Comments  AM PM       AM PM       AM PM       AM PM          AM PM       AM PM       AM PM       AM PM       Total Daily amount of Acetaminophen Do not take more than  3,000 mg per day      Day 4    Time  Name of Medication Number of pills taken  Amount of Acetaminophen  Pain Level   Comments  AM PM       AM PM       AM PM       AM PM       AM PM       AM PM       AM PM       AM PM       Total Daily amount of Acetaminophen Do not take more than  3,000 mg per day      Day 5    Time  Name of Medication Number of pills taken  Amount of Acetaminophen  Pain Level   Comments  AM PM       AM PM       AM PM       AM PM       AM PM       AM PM       AM PM         AM PM       Total Daily amount of Acetaminophen Do not take more than  3,000 mg per day       Day 6    Time  Name of Medication Number of pills taken  Amount of Acetaminophen  Pain Level  Comments  AM PM       AM PM       AM PM       AM PM       AM PM       AM PM       AM PM       AM PM       Total Daily amount of Acetaminophen Do not take more than  3,000 mg per day      Day 7    Time  Name of Medication Number of pills taken  Amount of Acetaminophen  Pain Level   Comments  AM PM       AM PM       AM PM       AM PM       AM PM       AM PM       AM PM       AM PM       Total Daily amount of Acetaminophen Do not take more than  3,000 mg per day        For additional information about how and where to safely dispose of unused opioid medications - https://www.morepowerfulnc.org  Disclaimer: This document contains information and/or instructional materials adapted from [facility]  for the typical patient with your condition. It does not replace medical advice from your health care provider because your experience may differ from that of the typical patient. Talk to your health care provider if you have any questions about this document, your condition or your treatment plan. Adapted from [facility]  

## 2021-03-26 NOTE — Anesthesia Postprocedure Evaluation (Signed)
Anesthesia Post Note  Patient: Thomas Thompson  Procedure(s) Performed: ENDOSCOPIC RETROGRADE CHOLANGIOPANCREATOGRAPHY (ERCP) WITH PROPOFOL (N/A ) SPHINCTEROTOMY REMOVAL OF STONES     Patient location during evaluation: PACU Anesthesia Type: General Level of consciousness: awake and alert and oriented Pain management: pain level controlled Vital Signs Assessment: post-procedure vital signs reviewed and stable Respiratory status: spontaneous breathing, nonlabored ventilation and respiratory function stable Cardiovascular status: blood pressure returned to baseline Postop Assessment: no apparent nausea or vomiting Anesthetic complications: no   No complications documented.            Kaylyn Layer

## 2021-03-26 NOTE — Anesthesia Postprocedure Evaluation (Signed)
Anesthesia Post Note  Patient: Thomas Thompson  Procedure(s) Performed: LAPAROSCOPIC CHOLECYSTECTOMY (N/A Abdomen)     Patient location during evaluation: PACU Anesthesia Type: General Level of consciousness: awake and alert Pain management: pain level controlled Vital Signs Assessment: post-procedure vital signs reviewed and stable Respiratory status: spontaneous breathing, nonlabored ventilation, respiratory function stable and patient connected to nasal cannula oxygen Cardiovascular status: blood pressure returned to baseline and stable Postop Assessment: no apparent nausea or vomiting Anesthetic complications: no   No complications documented.  Last Vitals:  Vitals:   03/26/21 1104 03/26/21 1120  BP: 124/82 128/85  Pulse: 60 62  Resp: 17 18  Temp: (!) 36.3 C 36.6 C  SpO2: 99% 96%    Last Pain:  Vitals:   03/26/21 1104  TempSrc:   PainSc: 3                  Racine Erby

## 2021-03-26 NOTE — Progress Notes (Signed)
Progress Note  Day of Surgery  Subjective: Feeling well and without complaints. Denies abdominal pain. Denies n/v.  States he is ready for surgery  Objective: Vital signs in last 24 hours: Temp:  [97.8 F (36.6 C)-98.6 F (37 C)] 97.9 F (36.6 C) (04/06 0514) Pulse Rate:  [51-67] 51 (04/06 0514) Resp:  [12-18] 16 (04/06 0514) BP: (103-129)/(34-92) 126/79 (04/06 0514) SpO2:  [93 %-100 %] 98 % (04/06 0514) Last BM Date: 03/24/21  Intake/Output from previous day: 04/05 0701 - 04/06 0700 In: 2607.9 [P.O.:360; I.V.:2147.9; IV Piggyback:100] Out: 250 [Urine:250] Intake/Output this shift: No intake/output data recorded.  PE: General: pleasant, WD, obese male who is laying in bed in NAD HEENT: Sclera are anicteric.  Heart: rrr Lungs: Respiratory effort nonlabored Abd: soft, NT, ND Psych: A&Ox3 with an appropriate affect.    Lab Results:  Recent Labs    03/25/21 0453 03/26/21 0139  WBC 5.6 5.9  HGB 13.5 13.8  HCT 41.1 42.2  PLT 170 178   BMET Recent Labs    03/25/21 0453 03/26/21 0139  NA 139 137  K 3.4* 3.8  CL 105 109  CO2 27 22  GLUCOSE 101* 173*  BUN 6 7  CREATININE 0.85 0.84  CALCIUM 8.7* 9.0   PT/INR No results for input(s): LABPROT, INR in the last 72 hours. CMP     Component Value Date/Time   NA 137 03/26/2021 0139   K 3.8 03/26/2021 0139   CL 109 03/26/2021 0139   CO2 22 03/26/2021 0139   GLUCOSE 173 (H) 03/26/2021 0139   BUN 7 03/26/2021 0139   CREATININE 0.84 03/26/2021 0139   CALCIUM 9.0 03/26/2021 0139   PROT 6.9 03/26/2021 0139   ALBUMIN 3.7 03/26/2021 0139   AST 85 (H) 03/26/2021 0139   ALT 222 (H) 03/26/2021 0139   ALKPHOS 159 (H) 03/26/2021 0139   BILITOT 0.9 03/26/2021 0139   GFRNONAA >60 03/26/2021 0139   Lipase     Component Value Date/Time   LIPASE 37 03/23/2021 0357       Studies/Results: DG ERCP BILIARY & PANCREATIC DUCTS  Result Date: 03/25/2021 CLINICAL DATA:  45 year old male with a history of  cholelithiasis/choledocholithiasis EXAM: ERCP TECHNIQUE: Multiple spot images obtained with the fluoroscopic device and submitted for interpretation post-procedure. FLUOROSCOPY TIME:  Fluoroscopy Time:  1 minutes 55 seconds COMPARISON:  None. FINDINGS: Limited intraoperative fluoroscopic spot images during ERCP. Initial image demonstrates the endoscope projecting over the upper abdomen. Subsequently there is cannulation of the ampulla with retrograde infusion of contrast partially opacifying the extrahepatic biliary ducts and the intrahepatic biliary ducts. There is deployment of a retrieval balloon. IMPRESSION: Limited images during ERCP demonstrates deployment of a retrieval balloon for treatment of choledocholithiasis. Please refer to the dictated operative report for full details of intraoperative findings and procedure. Electronically Signed   By: Gilmer Mor D.O.   On: 03/25/2021 15:15    Anti-infectives: Anti-infectives (From admission, onward)   Start     Dose/Rate Route Frequency Ordered Stop   03/25/21 1215  Ampicillin-Sulbactam (UNASYN) 3 g in sodium chloride 0.9 % 100 mL IVPB  Status:  Discontinued        3 g 200 mL/hr over 30 Minutes Intravenous  Once 03/25/21 1211 03/25/21 1504   03/24/21 1000  cefTRIAXone (ROCEPHIN) 2 g in sodium chloride 0.9 % 100 mL IVPB        2 g 200 mL/hr over 30 Minutes Intravenous Every 24 hours 03/23/21 1926  Assessment/Plan Morbid Obesity - BMI 41.09  Choledocholithiasis - ERCP showed stone had passed as per Dr. Christella Hartigan  -The anatomy and physiology of the hepatobiliary system was discussed at length with the patient again today. The pathophysiology of gallbladder disease was as well -The options for treatment were discussed including ongoing observation which may result in subsequent recurrent gallbladder complications (infection, pancreatitis, choledocholithiasis, etc). -We discussed laparoscopic cholecystectomy -The planned procedure,  material risks (including, but not limited to, pain, bleeding, infection, scarring, need for blood transfusion, damage to surrounding structures- blood vessels/nerves/viscus/organs, damage to bile duct, bile leak, need for additional procedures, hernia, worsening of pre-existing medical conditions, pancreatitis, pneumonia, heart attack, stroke, death) benefits and alternatives to surgery were discussed at length. I noted a good probability that the procedure would help improve their symptoms. The patient's questions were answered to his satisfaction, he voiced understanding and he elected to proceed with surgery. Additionally, we discussed typical postoperative expectations and the recovery process.  FEN: NPO, IVF VTE: lovenox ID: [email or web address] 4/4  LOS: 3 days   Marin Olp, MD [facility] Use [email or web address] to contact on call provider

## 2021-03-26 NOTE — Op Note (Signed)
03/23/2021 - 03/26/2021 10:14 AM  PATIENT: Thomas Thompson  45 y.o. male  Patient Care Team: Pcp, No as PCP - General  PRE-OPERATIVE DIAGNOSIS: Histoy of choledocholithiasis  POST-OPERATIVE DIAGNOSIS: Same + chronic cholecystitis  PROCEDURE: Laparoscopic cholecystectomy  SURGEON: Stephanie Coup. Elen Acero, MD  ASSISTANT: Trixie Deis, PA-C  ANESTHESIA: General endotracheal  EBL: Total I/O In: 1100 [I.V.:1000; IV Piggyback:100] Out: -   DRAINS: None  SPECIMEN: Gallbladder  COUNTS: Sponge, needle and instrument counts were reported correct x2 at the conclusion of the operation  DISPOSITION: PACU in satisfactory condition  COMPLICATIONS: None  FINDINGS: Chronic appearing scar tissue around infundibulum consistent with prior or subacute cholecystitis. Enlarged cystic duct consistent with his recent choledocholithiasis. Well vascularized gallbladder with multiple diminutive posterior cystic branches which were controlled with clips  DESCRIPTION:  The patient was identified & brought into the operating room. He was then positioned supine on the OR table. SCDs were in place and active during the entire case. He then underwent general endotracheal anesthesia. Pressure points were padded. Hair on the abdomen was clipped by the OR team. The abdomen was prepped and draped in the standard sterile fashion. Antibiotics were administered. A surgical timeout was performed and confirmed our plan.   A periumbilical incision was made. The umbilical stalk was grasped and retracted outwardly. The supraumbilical fascia was identified and incised. The peritoneal cavity was gently entered bluntly. A purse-string 0 Vicryl suture was placed. The Hasson cannula was inserted into the peritoneal cavity and insufflation with CO2 commenced to . A laparoscope was inserted into the peritoneal cavity and inspection confirmed no evidence of trocar site complications. The patient was then positioned in reverse  Trendelenburg with slight left side down. 3 additional 41mm trocars were placed along the right subcostal line - one 49mm port in mid subcostal region, another 63mm port in the right flank near the anterior axillary line, and a third 41mm port in the left subxiphoid region obliquely near the falciform ligament.  The liver and gallbladder were inspected.  He has rounded liver edges consistent with fatty liver disease.  The gallbladder is mildly inflamed in appearance. The gallbladder fundus was grasped and elevated cephalad. An additional grasper was then placed on the infundibulum of the gallbladder and the infundibulum was retracted laterally. Staying high on the gallbladder, the peritoneum on both sides of the gallbladder was opened with hook cautery. Gentle blunt dissection was then employed with a Art gallery manager working down into [employer]. The cystic duct was identified and carefully circumferentially dissected. The cystic artery was also identified and carefully circumferentially dissected. The space between the cystic artery and hepatocystic plate was developed such that a good view of the liver could be seen through a window medial to the cystic artery. The triangle of Calot had been cleared of all fibrofatty tissue. At this point, a critical view of safety was achieved and the only structures visualized was the skeletonized cystic duct laterally, the skeletonized cystic artery and the liver through the window medial to the artery.  He has multiple diminutive posterior cystic artery branches which are all ~ 1 mm in size.  A cholangiogram was not performed at this point as his cystic artery is relatively thin-walled fragile in appearance.  There is concern for causing a possible backwall injury or cystic duct avulsion during attempted passage of a cholangiogram catheter - therefore this was not performed.  The cystic is enlarged consistent with his known recent choledocholithiasis. The cystic  artery was clipped  with 2 clips on the patient side and 1 clip on the specimen side.  Additional diminutive posterior cystic artery branches were controlled with clips as well.  The cystic duct was just above the size threshold for controlling with a clip and therefore was divided using a blue load 45 mm GIA stapler. The gallbladder was then freed from its remaining attachments to the liver using electrocautery and placed into an endocatch bag.  Of note, there was a small amount of bile spillage during taking this down from the liver due to his habitus and large liver.  The RUQ was gently irrigated with sterile saline. Hemostasis was then verified. The clips were in good position; the gallbladder fossa was dry. The rest of the abdomen was inspected no injury nor bleeding elsewhere was identified.  The umbilical fascia was then closed using the 0 Vicryl purse-string suture. The fascia was palpated and noted to be completely closed by both palpation and direct visualization intra-abdominally. The endocatch bag containing the gallbladder was then removed from the umbilical port site and passed off as specimen. The RUQ ports were removed under direct visualization and noted to be hemostatic. The skin of all incision sites was approximated with 4-0 monocryl subcuticular suture and dermabond applied. He was then awakened from anesthesia, extubated, and transferred to a stretcher for transport to PACU in satisfactory condition.

## 2021-03-26 NOTE — Progress Notes (Signed)
Patient's Wedding band brung to OR with Patient in Pink Denture cup.I place Denture cup in bag place in chart and taking to PACU.

## 2021-03-26 NOTE — Anesthesia Procedure Notes (Signed)
Procedure Name: Intubation Date/Time: 03/26/2021 8:41 AM Performed by: Inda Coke, CRNA Pre-anesthesia Checklist: Patient identified, Emergency Drugs available, Suction available and Patient being monitored Patient Re-evaluated:Patient Re-evaluated prior to induction Oxygen Delivery Method: Circle System Utilized Preoxygenation: Pre-oxygenation with 100% oxygen Induction Type: IV induction Ventilation: Mask ventilation without difficulty Laryngoscope Size: Mac and 4 Grade View: Grade I Tube type: Oral Tube size: 7.5 mm Number of attempts: 1 Airway Equipment and Method: Stylet and Oral airway Placement Confirmation: ETT inserted through vocal cords under direct vision,  positive ETCO2 and breath sounds checked- equal and bilateral Secured at: 22 cm Tube secured with: Tape Dental Injury: Teeth and Oropharynx as per pre-operative assessment

## 2021-03-26 NOTE — Transfer of Care (Signed)
Immediate Anesthesia Transfer of Care Note  Patient: Thomas Thompson  Procedure(s) Performed: LAPAROSCOPIC CHOLECYSTECTOMY (N/A Abdomen)  Patient Location: PACU  Anesthesia Type:General  Level of Consciousness: awake and drowsy  Airway & Oxygen Therapy: Patient Spontanous Breathing and Patient connected to nasal cannula oxygen  Post-op Assessment: Report given to RN and Post -op Vital signs reviewed and stable  Post vital signs: Reviewed and stable  Last Vitals:  Vitals Value Taken Time  BP 129/83 03/26/21 1034  Temp 36.2 C 03/26/21 1034  Pulse 67 03/26/21 1039  Resp 17 03/26/21 1039  SpO2 92 % 03/26/21 1039  Vitals shown include unvalidated device data.  Last Pain:  Vitals:   03/26/21 0344  TempSrc:   PainSc: 0-No pain      Patients Stated Pain Goal: 3 (03/25/21 0204)  Complications: No complications documented.

## 2021-03-27 ENCOUNTER — Encounter (HOSPITAL_COMMUNITY): Payer: Self-pay | Admitting: Surgery

## 2021-03-27 LAB — SURGICAL PATHOLOGY

## 2021-03-27 MED ORDER — ACETAMINOPHEN 500 MG PO TABS
1000.0000 mg | ORAL_TABLET | Freq: Four times a day (QID) | ORAL | Status: DC
  Administered 2021-03-27 (×2): 1000 mg via ORAL
  Filled 2021-03-27 (×3): qty 2

## 2021-03-27 MED ORDER — IBUPROFEN 400 MG PO TABS
400.0000 mg | ORAL_TABLET | Freq: Four times a day (QID) | ORAL | Status: DC | PRN
  Administered 2021-03-27: 400 mg via ORAL
  Filled 2021-03-27: qty 1

## 2021-03-27 MED ORDER — OXYCODONE HCL 5 MG PO TABS: 5.0000 mg | ORAL_TABLET | Freq: Four times a day (QID) | ORAL | 0 refills | Status: DC | PRN

## 2021-03-27 MED ORDER — IBUPROFEN 200 MG PO TABS: 400.0000 mg | ORAL_TABLET | Freq: Four times a day (QID) | ORAL | Status: AC | PRN

## 2021-03-27 MED ORDER — ACETAMINOPHEN 500 MG PO TABS: 1000.0000 mg | ORAL_TABLET | Freq: Four times a day (QID) | ORAL | Status: AC | PRN

## 2021-03-27 MED ORDER — ENOXAPARIN SODIUM 40 MG/0.4ML ~~LOC~~ SOLN
40.0000 mg | SUBCUTANEOUS | Status: DC
  Filled 2021-03-27: qty 0.4

## 2021-03-27 NOTE — Discharge Summary (Signed)
[facility] Discharge Summary   Patient ID: Thomas Thompson MRN: [id number] DOB/AGE: 01/12/1976 45 y.o.  Admit date: 03/23/2021 Discharge date: 03/27/2021  Admitting Diagnosis: Choledocholithiasis  Discharge Diagnosis Chronic cholecystitis  Choledocholithiasis  S/P laparoscopic cholecystectomy   Consultants Gastroenterology  Imaging: DG ERCP BILIARY & PANCREATIC DUCTS  Result Date: 03/25/2021 CLINICAL DATA:  45 year old male with a history of cholelithiasis/choledocholithiasis EXAM: ERCP TECHNIQUE: Multiple spot images obtained with the fluoroscopic device and submitted for interpretation post-procedure. FLUOROSCOPY TIME:  Fluoroscopy Time:  1 minutes 55 seconds COMPARISON:  None. FINDINGS: Limited intraoperative fluoroscopic spot images during ERCP. Initial image demonstrates the endoscope projecting over the upper abdomen. Subsequently there is cannulation of the ampulla with retrograde infusion of contrast partially opacifying the extrahepatic biliary ducts and the intrahepatic biliary ducts. There is deployment of a retrieval balloon. IMPRESSION: Limited images during ERCP demonstrates deployment of a retrieval balloon for treatment of choledocholithiasis. Please refer to the dictated operative report for full details of intraoperative findings and procedure. Electronically Signed   By: Gilmer Mor D.O.   On: 03/25/2021 15:15    Procedures Dr. Rob Bunting (03/25/21) - ERCP Dr. Marin Olp (03/26/21) - Laparoscopic Cholecystectomy   Hospital Course:  Patient is a 45 year old male who presented to [facility] with abdominal pain.  Workup showed choledocholithiasis.  Patient was admitted and underwent procedures listed above.  Tolerated procedure well and was transferred to the floor.  Diet was advanced as tolerated.  On POD#1, the patient was voiding well, tolerating diet, ambulating well, pain well controlled, vital signs stable, incisions c/d/i and felt stable for discharge  home.  Patient will follow up in our office in 3-4 weeks and knows to call with questions or concerns.  He will call to confirm appointment date/time.    Physical Exam: General:  Alert, NAD, pleasant, comfortable Abd:  Soft, ND, mild tenderness, incisions C/D/I  I or a member of my team have reviewed this patient in the Controlled Substance Database.   Allergies as of 03/27/2021   No Known Allergies     Medication List    TAKE these medications   acetaminophen 500 MG tablet Commonly known as: TYLENOL Take 2 tablets (1,000 mg total) by mouth every 6 (six) hours as needed for mild pain or fever.   ibuprofen 200 MG tablet Commonly known as: ADVIL Take 2 tablets (400 mg total) by mouth every 6 (six) hours as needed for fever, headache or mild pain.   omeprazole 40 MG capsule Commonly known as: PRILOSEC Take 1 capsule by mouth daily.   oxyCODONE 5 MG immediate release tablet Commonly known as: Oxy IR/ROXICODONE Take 1 tablet (5 mg total) by mouth every 6 (six) hours as needed for moderate pain.   Probiotic 250 MG Caps Take 250 mg by mouth daily.         Follow-up Information    [facility], [facility]. Go on 04/15/2021.   Specialty: General Surgery Why: 11:15 AM. Please arrive 30 min prior to appointment time. Bring photo ID and insurance information.  Contact information: [address] [place] [address] [phone]               Signed: Juliet Rude , [facility] 03/27/2021, 10:45 AM Please see Amion for pager number during day hours 7:00am-4:30pm

## 2021-03-27 NOTE — Plan of Care (Signed)

## 2021-03-27 NOTE — Plan of Care (Signed)
D/c to home breathing regular and unlabored on room air instructions given and explained with understanding VSS. Pain 3/10.  D/c with wife to home

## 2022-07-22 IMAGING — RF DG ERCP WO/W SPHINCTEROTOMY
1 series · 15 of 24 positions shown · non-contrast
Comparison: None.

CLINICAL DATA: 44-year-old male with a history of
cholelithiasis/choledocholithiasis

EXAM:
ERCP
TECHNIQUE: Multiple spot images obtained with the fluoroscopic device and
submitted for interpretation post-procedure.
FLUOROSCOPY TIME:  Fluoroscopy Time:  1 minutes 55 seconds

[Series 1: unknown protocol · 0.20mm/px · 10 acquisitions, 15 frames shown]
[im 1/10]
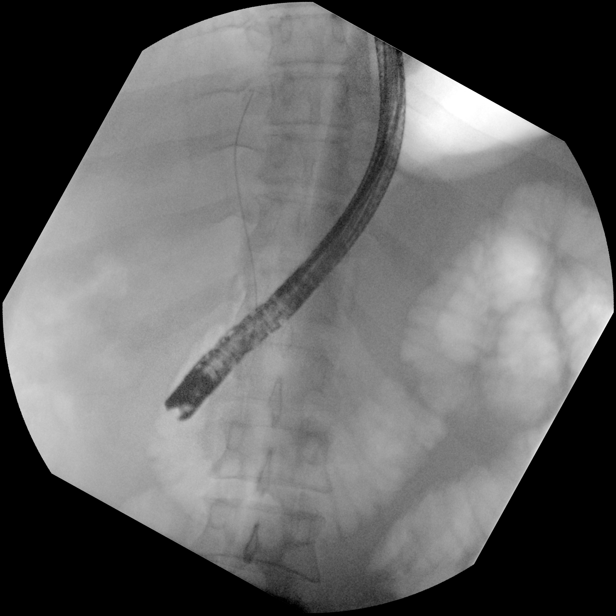
[im 1/10]
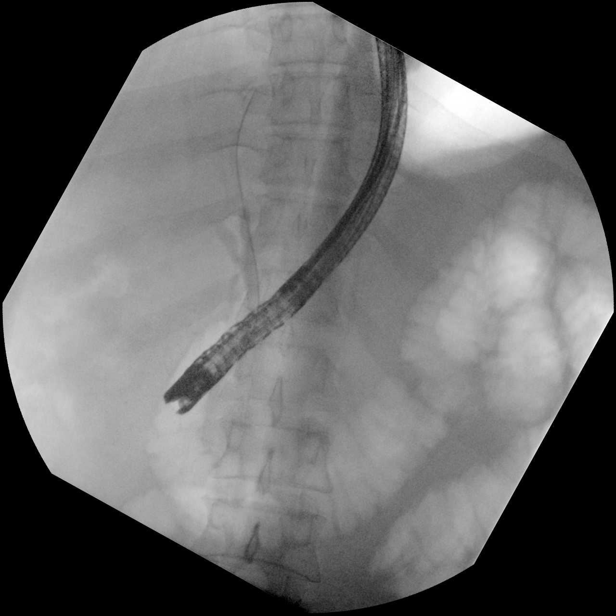
[im 2/10]
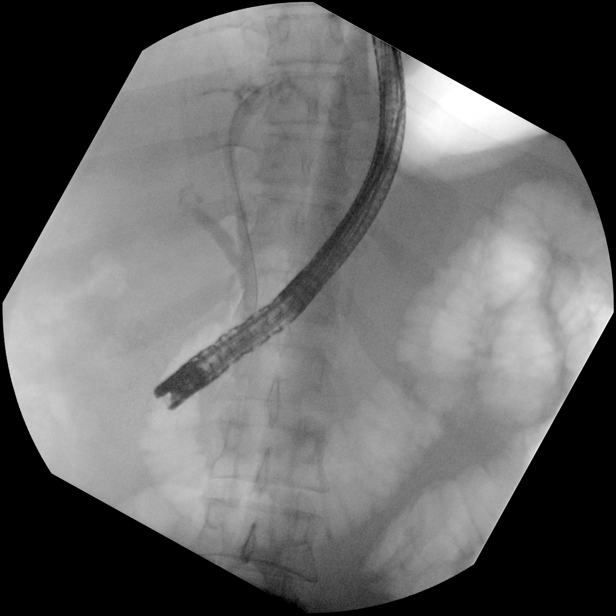
[im 2/10]
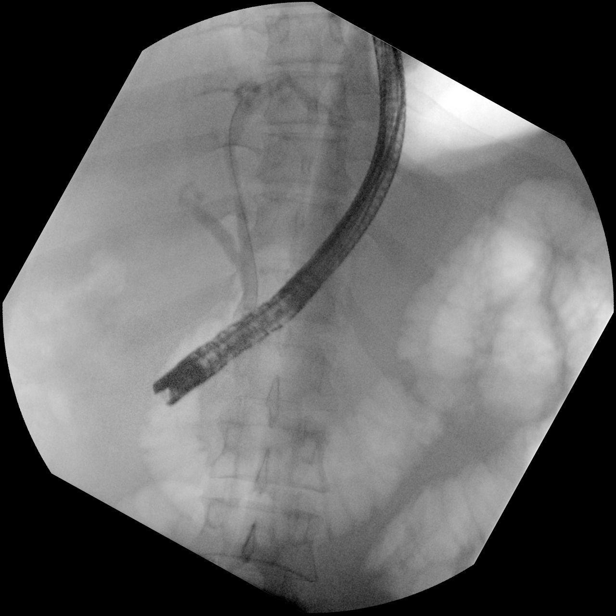
[im 3/10]
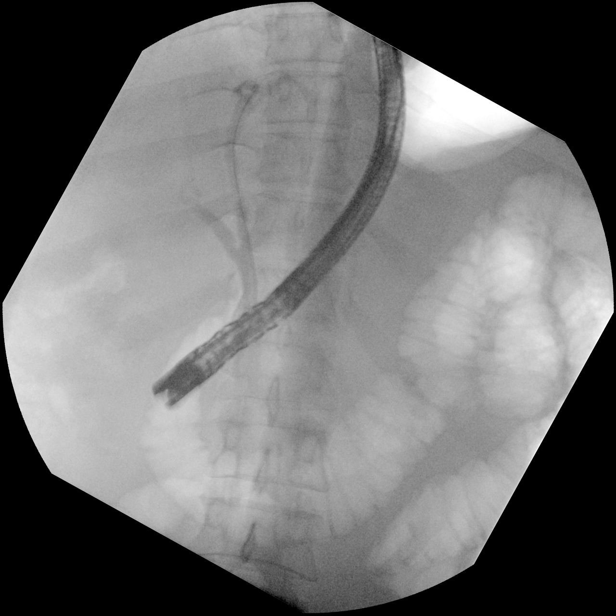
[im 4/10]
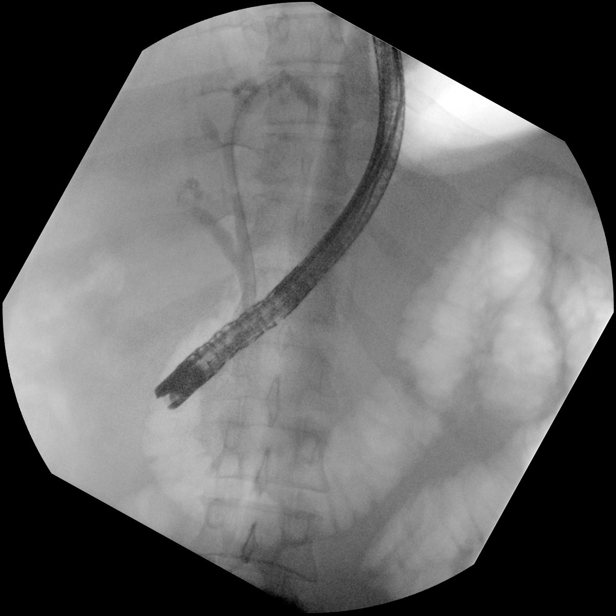
[im 4/10]
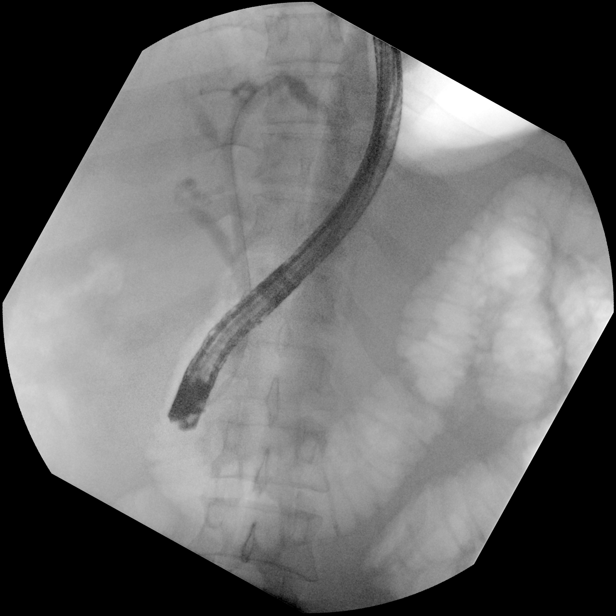
[im 5/10]
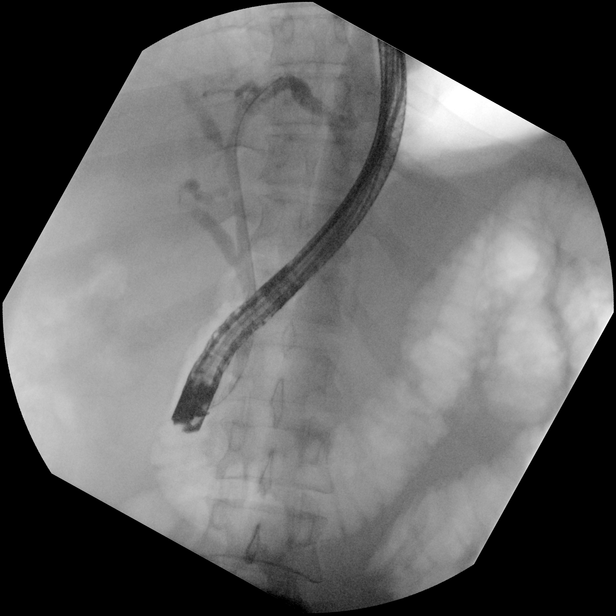
[im 5/10]
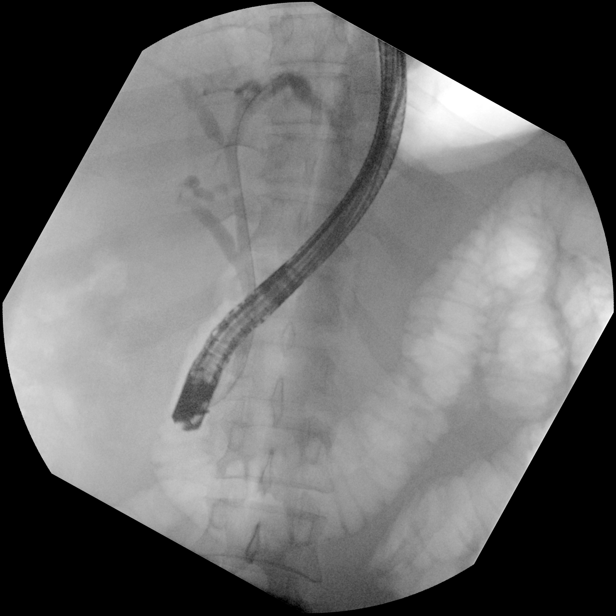
[im 7/10]
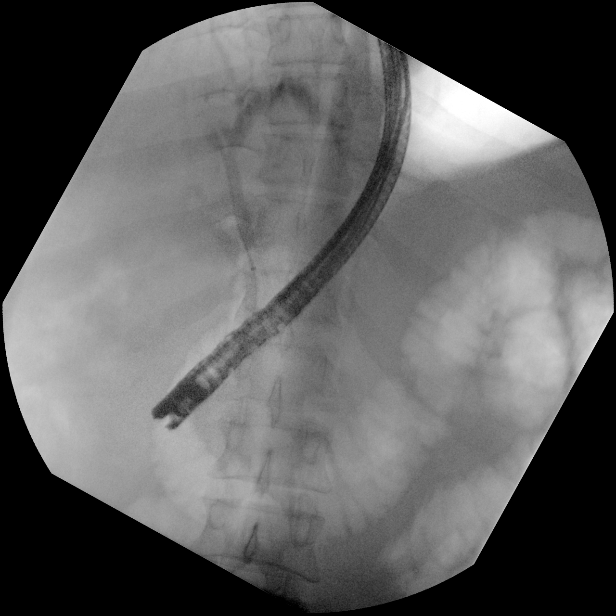
[im 7/10]
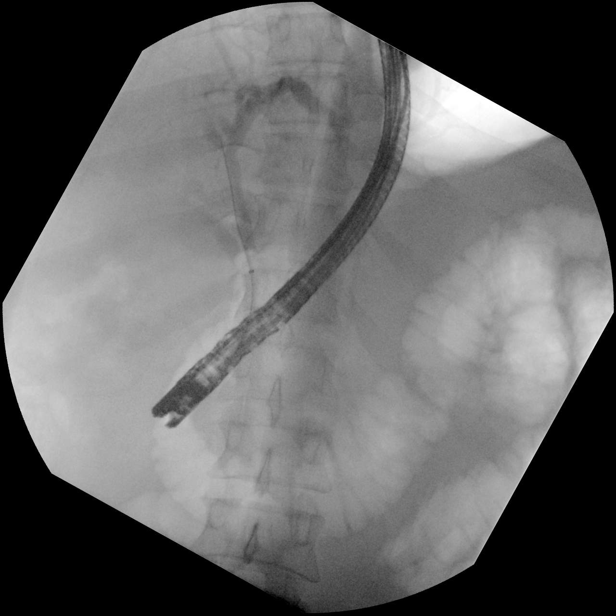
[im 7/10]
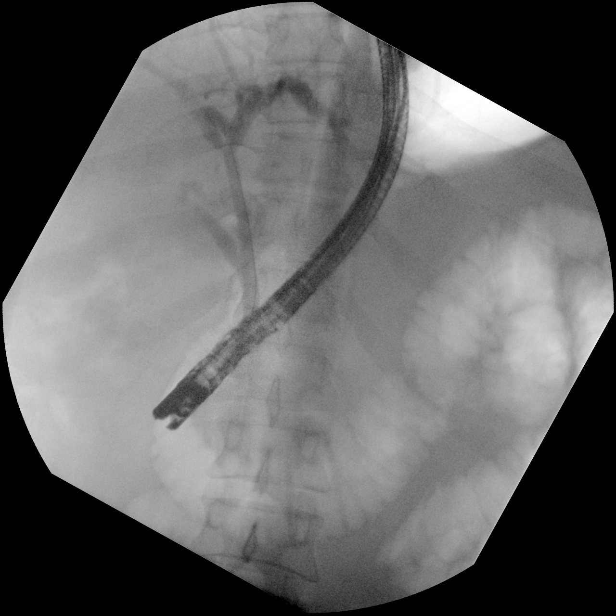
[im 9/10]
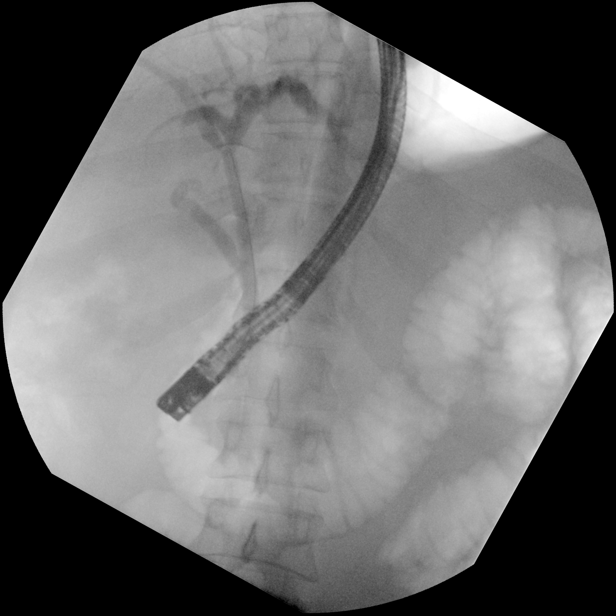
[im 9/10]
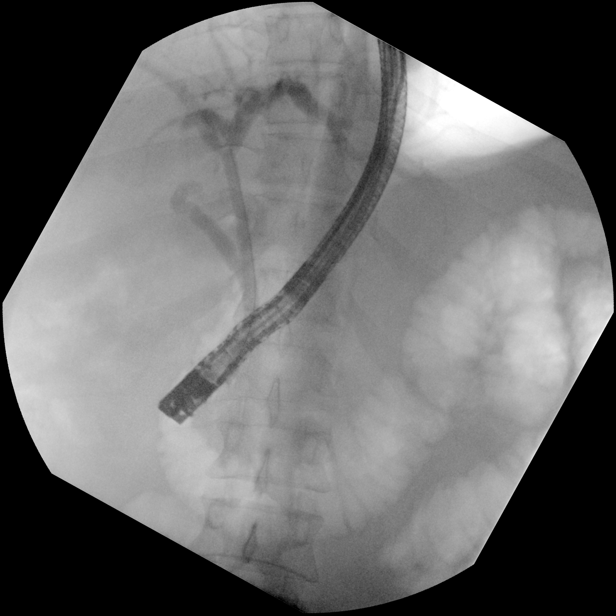
[im 10/10]
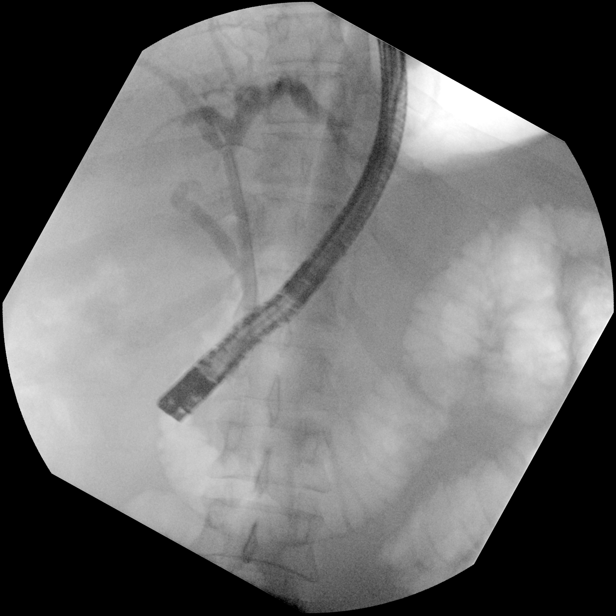

[15 of 24 positions shown; findings below may reference images not displayed]

FINDINGS: Limited intraoperative fluoroscopic spot images during ERCP.

Initial image demonstrates the endoscope projecting over the upper
abdomen.

Subsequently there is cannulation of the ampulla with retrograde
infusion of contrast partially opacifying the extrahepatic biliary
ducts and the intrahepatic biliary ducts.

There is deployment of a retrieval balloon.
IMPRESSION: Limited images during ERCP demonstrates deployment of a retrieval
balloon for treatment of choledocholithiasis. Please refer to the
dictated operative report for full details of intraoperative
findings and procedure.

## 2023-01-09 DIAGNOSIS — Z713 Dietary counseling and surveillance: Secondary | ICD-10-CM | POA: Diagnosis not present

## 2023-01-09 DIAGNOSIS — Z136 Encounter for screening for cardiovascular disorders: Secondary | ICD-10-CM | POA: Diagnosis not present

## 2023-01-09 DIAGNOSIS — Z13220 Encounter for screening for lipoid disorders: Secondary | ICD-10-CM | POA: Diagnosis not present

## 2023-01-09 DIAGNOSIS — Z6841 Body Mass Index (BMI) 40.0 and over, adult: Secondary | ICD-10-CM | POA: Diagnosis not present

## 2023-03-07 DIAGNOSIS — R21 Rash and other nonspecific skin eruption: Secondary | ICD-10-CM | POA: Diagnosis not present

## 2023-03-07 DIAGNOSIS — L298 Other pruritus: Secondary | ICD-10-CM | POA: Diagnosis not present

## 2023-03-29 DIAGNOSIS — L2089 Other atopic dermatitis: Secondary | ICD-10-CM | POA: Diagnosis not present

## 2023-04-28 DIAGNOSIS — D485 Neoplasm of uncertain behavior of skin: Secondary | ICD-10-CM | POA: Diagnosis not present

## 2023-04-28 DIAGNOSIS — L2089 Other atopic dermatitis: Secondary | ICD-10-CM | POA: Diagnosis not present

## 2023-04-28 DIAGNOSIS — R21 Rash and other nonspecific skin eruption: Secondary | ICD-10-CM | POA: Diagnosis not present

## 2023-05-31 DIAGNOSIS — L2089 Other atopic dermatitis: Secondary | ICD-10-CM | POA: Diagnosis not present

## 2023-07-22 DIAGNOSIS — L2089 Other atopic dermatitis: Secondary | ICD-10-CM | POA: Diagnosis not present

## 2024-01-31 DIAGNOSIS — L218 Other seborrheic dermatitis: Secondary | ICD-10-CM | POA: Diagnosis not present

## 2024-01-31 DIAGNOSIS — L2089 Other atopic dermatitis: Secondary | ICD-10-CM | POA: Diagnosis not present

## 2024-01-31 DIAGNOSIS — L291 Pruritus scroti: Secondary | ICD-10-CM | POA: Diagnosis not present

## 2024-01-31 DIAGNOSIS — Z79899 Other long term (current) drug therapy: Secondary | ICD-10-CM | POA: Diagnosis not present

## 2024-02-07 DIAGNOSIS — Z136 Encounter for screening for cardiovascular disorders: Secondary | ICD-10-CM | POA: Diagnosis not present

## 2024-02-07 DIAGNOSIS — Z6841 Body Mass Index (BMI) 40.0 and over, adult: Secondary | ICD-10-CM | POA: Diagnosis not present

## 2024-02-07 DIAGNOSIS — Z13220 Encounter for screening for lipoid disorders: Secondary | ICD-10-CM | POA: Diagnosis not present

## 2024-02-07 DIAGNOSIS — Z131 Encounter for screening for diabetes mellitus: Secondary | ICD-10-CM | POA: Diagnosis not present

## 2024-02-07 DIAGNOSIS — Z713 Dietary counseling and surveillance: Secondary | ICD-10-CM | POA: Diagnosis not present

## 2024-07-31 DIAGNOSIS — L2089 Other atopic dermatitis: Secondary | ICD-10-CM | POA: Diagnosis not present

## 2024-07-31 DIAGNOSIS — Z79899 Other long term (current) drug therapy: Secondary | ICD-10-CM | POA: Diagnosis not present

## 2024-09-11 ENCOUNTER — Encounter: Admitting: Urology

## 2024-09-18 ENCOUNTER — Encounter: Payer: Self-pay | Admitting: Family Medicine

## 2024-09-18 ENCOUNTER — Ambulatory Visit: Admitting: Family Medicine

## 2024-09-18 VITALS — BP 112/74 | HR 77 | Ht 74.0 in | Wt 312.0 lb

## 2024-09-18 DIAGNOSIS — Z130 Encounter for screening for diseases of the blood and blood-forming organs and certain disorders involving the immune mechanism: Secondary | ICD-10-CM

## 2024-09-18 DIAGNOSIS — Z1211 Encounter for screening for malignant neoplasm of colon: Secondary | ICD-10-CM

## 2024-09-18 DIAGNOSIS — Z6841 Body Mass Index (BMI) 40.0 and over, adult: Secondary | ICD-10-CM

## 2024-09-18 DIAGNOSIS — Z23 Encounter for immunization: Secondary | ICD-10-CM | POA: Diagnosis not present

## 2024-09-18 DIAGNOSIS — Z125 Encounter for screening for malignant neoplasm of prostate: Secondary | ICD-10-CM

## 2024-09-18 DIAGNOSIS — Z136 Encounter for screening for cardiovascular disorders: Secondary | ICD-10-CM | POA: Diagnosis not present

## 2024-09-18 DIAGNOSIS — Z131 Encounter for screening for diabetes mellitus: Secondary | ICD-10-CM

## 2024-09-18 DIAGNOSIS — Z13220 Encounter for screening for lipoid disorders: Secondary | ICD-10-CM

## 2024-09-18 DIAGNOSIS — E66813 Obesity, class 3: Secondary | ICD-10-CM

## 2024-09-18 DIAGNOSIS — R399 Unspecified symptoms and signs involving the genitourinary system: Secondary | ICD-10-CM | POA: Diagnosis not present

## 2024-09-18 MED ORDER — TAMSULOSIN HCL 0.4 MG PO CAPS: 0.4000 mg | ORAL_CAPSULE | Freq: Every evening | ORAL | 3 refills | Status: DC

## 2024-09-18 NOTE — Progress Notes (Signed)
 New Patient Office Visit  Subjective    Patient ID: Thomas Thompson, male    DOB: [birth year]  Age: 48 y.o. MRN: [id number]  CC:  Chief Complaint  Patient presents with   Referral    Urology - waking up a lot to urinate, 2 -3 times a night, no pain with urination   Establish Care     Assessment & Plan:   Lower urinary tract symptoms (LUTS) -     Urinalysis, Routine w reflex microscopic -     Comprehensive metabolic panel with GFR -     Tamsulosin HCl; Take 1 capsule (0.4 mg total) by mouth at bedtime.  Dispense: 30 capsule; Refill: 3  Diabetes mellitus screening -     Hemoglobin A1c  Screening for colon cancer -     Ambulatory referral to Gastroenterology  Screening for prostate cancer -     PSA  Screening for iron deficiency anemia -     CBC with Differential/Platelet  Class 3 severe obesity due to excess calories with serious comorbidity and body mass index (BMI) of 40.0 to 44.9 in adult -     TSH  Encounter for lipid screening for cardiovascular disease -     Lipid panel   Assessment and Plan Assessment & Plan Lower urinary tract symptoms due to suspected benign prostatic hyperplasia Symptoms suggest BPH due to bladder pressure. Differential includes UTI. - Order urinalysis and urine culture. - Prescribe Flomax 1 pill daily at bedtime. Advise on potential dizziness. - Order blood work: diabetes screening, cholesterol, blood count, liver and kidney function tests, PSA levels. - Schedule follow-up in a few weeks. - Consider urology referral if symptoms persist.  Obesity Obesity may contribute to urinary symptoms and sleep apnea risk. Sedentary occupation impacts weight. - Discuss weight management strategies and lifestyle modifications.  General Health Maintenance Due for colonoscopy at age 35. Discussed flu vaccination importance. - Schedule colonoscopy. - Administer flu shot.    Return in about 2 months (around 11/18/2024) for Annual.   Vinary K  [name], MD   HPI   [name] presents to establish care Discussed the use of AI scribe software for clinical note transcription with the patient, who gave verbal consent to proceed.  History of Present Illness Thomas Thompson is a 48 year old male who presents with increased urination and urgency.  He has experienced increased urination, particularly nocturia, for the past month. There is a sudden and urgent need to urinate, often with little output, and sometimes he feels the need to urinate again shortly after. He denies dysuria or hematuria, although he did experience pain once while urinating, which resolved. He notes a pungent smell to his urine.  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 He has experienced dribbling twice, which he describes as 'a little bit coming out.'  His past medical history includes eczema, for which he uses Dupixent. He had a kidney stone over ten years ago, which he passed without complications. He has not had a regular physical exam recently, only Department of Transportation (DOT) physicals, the last of which was over a year ago.  Family history is significant for heart attack on his father's side at age 36 and thyroid issues on his mother's side.  No significant changes in fluid intake, particularly at night, and no significant snoring or sleep disturbances aside from waking to urinate.    Outpatient Encounter Medications as of  09/18/2024  Medication Sig   acetaminophen  (TYLENOL ) 500 MG tablet Take 2 tablets (1,000 mg total) by mouth every 6 (six) hours as needed for mild pain or fever.   clobetasol cream (TEMOVATE) 0.05 % Apply 1 Application topically 2 (two) times daily. (Patient taking differently: Apply 1 Application topically as needed.)   DUPIXENT 300 MG/2ML SOAJ    ibuprofen  (ADVIL ) 200 MG tablet Take 2 tablets (400 mg total) by mouth every 6 (six) hours as needed for fever, headache or  mild pain.   tamsulosin (FLOMAX) 0.4 MG CAPS capsule Take 1 capsule (0.4 mg total) by mouth at bedtime.   omeprazole (PRILOSEC) 40 MG capsule Take 1 capsule by mouth daily. (Patient not taking: Reported on 09/18/2024)   oxyCODONE  (OXY IR/ROXICODONE ) 5 MG immediate release tablet Take 1 tablet (5 mg total) by mouth every 6 (six) hours as needed for moderate pain. (Patient not taking: Reported on 09/18/2024)   Saccharomyces boulardii (PROBIOTIC) 250 MG CAPS Take 250 mg by mouth daily. (Patient not taking: Reported on 09/18/2024)   No facility-administered encounter medications on file as of 09/18/2024.      Review of Systems  All other systems reviewed and are negative.       Objective    BP 112/74   Pulse 77   Ht 6' 2 (1.88 m)   Wt (!) 312 lb (141.5 kg)   SpO2 97%   BMI 40.06 kg/m   Physical Exam Vitals and nursing note reviewed.  Constitutional:      Appearance: Normal appearance.  HENT:     Head: Normocephalic.     Right Ear: External ear normal.     Left Ear: External ear normal.  Eyes:     Conjunctiva/sclera: Conjunctivae normal.  Cardiovascular:     Rate and Rhythm: Normal rate.  Pulmonary:     Effort: Pulmonary effort is normal. No respiratory distress.  Abdominal:     Palpations: Abdomen is soft.  Musculoskeletal:        General: Normal range of motion.  Skin:    General: Skin is warm.  Neurological:     Mental Status: He is alert and oriented to person, place, and time.  Psychiatric:        Mood and Affect: Mood normal.

## 2024-09-19 LAB — CBC WITH DIFFERENTIAL/PLATELET
Basophils Absolute: 0.1 x10E3/uL (ref 0.0–0.2)
Basos: 1 %
EOS (ABSOLUTE): 0.2 x10E3/uL (ref 0.0–0.4)
Eos: 2 %
Hematocrit: 45.9 % (ref 37.5–51.0)
Hemoglobin: 15.4 g/dL (ref 13.0–17.7)
Immature Grans (Abs): 0 x10E3/uL (ref 0.0–0.1)
Immature Granulocytes: 0 %
Lymphocytes Absolute: 1.3 x10E3/uL (ref 0.7–3.1)
Lymphs: 18 %
MCH: 30 pg (ref 26.6–33.0)
MCHC: 33.6 g/dL (ref 31.5–35.7)
MCV: 89 fL (ref 79–97)
Monocytes Absolute: 0.6 x10E3/uL (ref 0.1–0.9)
Monocytes: 8 %
Neutrophils Absolute: 5.4 x10E3/uL (ref 1.4–7.0)
Neutrophils: 71 %
Platelets: 245 x10E3/uL (ref 150–450)
RBC: 5.14 x10E6/uL (ref 4.14–5.80)
RDW: 13.7 % (ref 11.6–15.4)
WBC: 7.5 x10E3/uL (ref 3.4–10.8)

## 2024-09-19 LAB — LIPID PANEL
Chol/HDL Ratio: 4.4 ratio (ref 0.0–5.0)
Cholesterol, Total: 135 mg/dL (ref 100–199)
HDL: 31 mg/dL — ABNORMAL LOW (ref >39)
LDL Chol Calc (NIH): 85 mg/dL (ref 0–99)
Triglycerides: 98 mg/dL (ref 0–149)
VLDL Cholesterol Cal: 19 mg/dL (ref 5–40)

## 2024-09-19 LAB — COMPREHENSIVE METABOLIC PANEL WITH GFR
ALT: 35 IU/L (ref 0–44)
AST: 22 IU/L (ref 0–40)
Albumin: 4.4 g/dL (ref 4.1–5.1)
Alkaline Phosphatase: 111 IU/L (ref 47–123)
BUN/Creatinine Ratio: 9 (ref 9–20)
BUN: 11 mg/dL (ref 6–24)
Bilirubin Total: 0.4 mg/dL (ref 0.0–1.2)
CO2: 23 mmol/L (ref 20–29)
Calcium: 9.1 mg/dL (ref 8.7–10.2)
Chloride: 105 mmol/L (ref 96–106)
Creatinine, Ser: 1.16 mg/dL (ref 0.76–1.27)
Globulin, Total: 2.7 g/dL (ref 1.5–4.5)
Glucose: 75 mg/dL (ref 70–99)
Potassium: 4.3 mmol/L (ref 3.5–5.2)
Sodium: 141 mmol/L (ref 134–144)
Total Protein: 7.1 g/dL (ref 6.0–8.5)
eGFR: 79 mL/min/1.73 (ref >59)

## 2024-09-19 LAB — URINALYSIS, ROUTINE W REFLEX MICROSCOPIC
Bilirubin, UA: NEGATIVE
Glucose, UA: NEGATIVE
Leukocytes,UA: NEGATIVE
Nitrite, UA: NEGATIVE
Protein,UA: NEGATIVE
RBC, UA: NEGATIVE
Specific Gravity, UA: 1.026 (ref 1.005–1.030)
Urobilinogen, Ur: 0.2 mg/dL (ref 0.2–1.0)
pH, UA: 6 (ref 5.0–7.5)

## 2024-09-19 LAB — TSH: TSH: 0.854 u[IU]/mL (ref 0.450–4.500)

## 2024-09-19 LAB — HEMOGLOBIN A1C
Est. average glucose Bld gHb Est-mCnc: 97 mg/dL
Hgb A1c MFr Bld: 5 % (ref 4.8–5.6)

## 2024-09-19 LAB — PSA: Prostate Specific Ag, Serum: 0.6 ng/mL (ref 0.0–4.0)

## 2024-09-20 ENCOUNTER — Ambulatory Visit: Payer: Self-pay | Admitting: Family Medicine

## 2024-09-25 ENCOUNTER — Ambulatory Visit: Admitting: Family Medicine

## 2024-09-25 ENCOUNTER — Encounter: Payer: Self-pay | Admitting: Family Medicine

## 2024-09-26 ENCOUNTER — Encounter: Payer: Self-pay | Admitting: Family Medicine

## 2024-09-26 ENCOUNTER — Ambulatory Visit: Admitting: Family Medicine

## 2024-09-26 VITALS — BP 114/70 | HR 72 | Ht 74.0 in | Wt 312.5 lb

## 2024-09-26 DIAGNOSIS — R399 Unspecified symptoms and signs involving the genitourinary system: Secondary | ICD-10-CM | POA: Diagnosis not present

## 2024-09-26 NOTE — Progress Notes (Signed)
   Established Patient Office Visit  Subjective   Patient ID: Thomas Thompson, male    DOB: Nov 10, 1976  Age: 48 y.o. MRN: [id number]  Chief Complaint  Patient presents with   Medication Problem    Flomax causing him to be dizzy, light headed, had to leave work because of it and now his work wants him to get a note to able to return to work     Assessment & Plan:   Problem List Items Addressed This Visit   None Visit Diagnoses       Lower urinary tract symptoms (LUTS)    -  Primary     Assessment and Plan Assessment & Plan Benign prostatic hyperplasia with lower urinary tract symptoms Symptoms improved with Tamsulosin. Better urinary stream and increased sleep duration. No urinary leakage. Initial side effects resolved. - Continue Tamsulosin 0.4 MG oral at bedtime. - Advised to return if symptoms worsen or recur, such as incomplete bladder emptying or lower abdominal discomfort.    No follow-ups on file.   HPI Discussed the use of AI scribe software for clinical note transcription with the patient, who gave verbal consent to proceed.  History of Present Illness Thomas Thompson is a 48 year old male who presents for a follow-up regarding urinary symptoms and a request for a return-to-work letter.  After starting Flomax, his urinary symptoms have improved significantly. His urinary stream feels larger, and he is able to sleep four to five hours before waking up, which is a significant improvement from previously waking every hour. No urinary leakage is reported.  He experienced dizziness on the first day of taking Flomax, which resolved after sleeping. A brief episode of knee weakness occurred the following morning, causing him to fall, but these symptoms have since resolved.  He is not currently taking any blood pressure medications, and his blood pressure is reported to be 114/70.  No urinary leakage. Reports improved urinary stream and sleep duration.     Review of  Systems  All other systems reviewed and are negative.     Objective:     BP 114/70   Pulse 72   Ht 6' 2 (1.88 m)   Wt (!) 312 lb 8 oz (141.7 kg)   SpO2 98%   BMI 40.12 kg/m     Physical Exam Vitals and nursing note reviewed.  Constitutional:      Appearance: Normal appearance.  HENT:     Head: Normocephalic.     Right Ear: External ear normal.     Left Ear: External ear normal.  Eyes:     Conjunctiva/sclera: Conjunctivae normal.  Cardiovascular:     Rate and Rhythm: Normal rate.  Pulmonary:     Effort: Pulmonary effort is normal. No respiratory distress.  Abdominal:     Palpations: Abdomen is soft.  Musculoskeletal:        General: Normal range of motion.  Skin:    General: Skin is warm.  Neurological:     Mental Status: He is alert and oriented to person, place, and time.  Psychiatric:        Mood and Affect: Mood normal.      No results found for any visits on 09/26/24.     The 10-year ASCVD risk score (Arnett DK, et al., 2019) is: 1.7%      Vinary K Dontrelle Mazon, MD

## 2024-10-12 ENCOUNTER — Other Ambulatory Visit: Payer: Self-pay

## 2024-10-12 ENCOUNTER — Telehealth: Payer: Self-pay

## 2024-10-12 DIAGNOSIS — Z1211 Encounter for screening for malignant neoplasm of colon: Secondary | ICD-10-CM

## 2024-10-12 MED ORDER — NA SULFATE-K SULFATE-MG SULF 17.5-3.13-1.6 GM/177ML PO SOLN: 1.0000 | Freq: Once | ORAL | 0 refills | Status: AC

## 2024-10-12 NOTE — Telephone Encounter (Signed)
 Gastroenterology Pre-Procedure Review  Request Date: 12/27/24 Requesting Physician: Dr. Melany  PATIENT REVIEW QUESTIONS: The patient responded to the following health history questions as indicated:    1. Are you having any GI issues? no 2. Do you have a personal history of Polyps? no 3. Do you have a family history of Colon Cancer or Polyps? no 4. Diabetes Mellitus? no 5. Joint replacements in the past 12 months?no 6. Major health problems in the past 3 months?no 7. Any artificial heart valves, MVP, or defibrillator?no    MEDICATIONS & ALLERGIES:    Patient reports the following regarding taking any anticoagulation/antiplatelet therapy:   Plavix, Coumadin, Eliquis, Xarelto, Lovenox , Pradaxa, Brilinta, or Effient? no Aspirin? no  Patient confirms/reports the following medications:  Current Outpatient Medications  Medication Sig Dispense Refill   acetaminophen  (TYLENOL ) 500 MG tablet Take 2 tablets (1,000 mg total) by mouth every 6 (six) hours as needed for mild pain or fever.     clobetasol cream (TEMOVATE) 0.05 % Apply 1 Application topically 2 (two) times daily.     DUPIXENT 300 MG/2ML SOAJ      ibuprofen  (ADVIL ) 200 MG tablet Take 2 tablets (400 mg total) by mouth every 6 (six) hours as needed for fever, headache or mild pain.     omeprazole (PRILOSEC) 40 MG capsule Take 1 capsule by mouth daily. (Patient not taking: Reported on 09/18/2024)     oxyCODONE  (OXY IR/ROXICODONE ) 5 MG immediate release tablet Take 1 tablet (5 mg total) by mouth every 6 (six) hours as needed for moderate pain. (Patient not taking: Reported on 09/18/2024) 15 tablet 0   Saccharomyces boulardii (PROBIOTIC) 250 MG CAPS Take 250 mg by mouth daily. (Patient not taking: Reported on 09/18/2024)     tamsulosin (FLOMAX) 0.4 MG CAPS capsule Take 1 capsule (0.4 mg total) by mouth at bedtime. 30 capsule 3   No current facility-administered medications for this visit.    Patient confirms/reports the following  allergies:  No Known Allergies  No orders of the defined types were placed in this encounter.   AUTHORIZATION INFORMATION Primary Insurance: 1D#: Group #:  Secondary Insurance: 1D#: Group #:  SCHEDULE INFORMATION: Date:  Time: Location:

## 2024-11-09 ENCOUNTER — Other Ambulatory Visit: Payer: Self-pay | Admitting: Family Medicine

## 2024-11-09 DIAGNOSIS — R399 Unspecified symptoms and signs involving the genitourinary system: Secondary | ICD-10-CM

## 2024-11-10 NOTE — Telephone Encounter (Signed)
 Requested medication (s) are due for refill today: yes   Requested medication (s) are on the active medication list: yes   Last refill:  09/18/24 #30 3 refills  Future visit scheduled: yes 11/20/24  Notes to clinic:  Pharmacy comment: REQUEST FOR 90 DAYS PRESCRIPTION. DX Code Needed.      Requested Prescriptions  Pending Prescriptions Disp Refills   tamsulosin  (FLOMAX ) 0.4 MG CAPS capsule [Pharmacy Med Name: TAMSULOSIN  HCL 0.4 MG CAPSULE] 90 capsule 2    Sig: TAKE 1 CAPSULE BY MOUTH EVERYDAY AT BEDTIME     Urology: Alpha-Adrenergic Blocker Passed - 11/10/2024  4:42 PM      Passed - PSA in normal range and within 360 days    Prostate Specific Ag, Serum  Date Value Ref Range Status  09/18/2024 0.6 0.0 - 4.0 ng/mL Final    Comment:    Roche ECLIA methodology. According to the American Urological Association, Serum PSA should decrease and remain at undetectable levels after radical prostatectomy. The AUA defines biochemical recurrence as an initial PSA value 0.2 ng/mL or greater followed by a subsequent confirmatory PSA value 0.2 ng/mL or greater. Values obtained with different assay methods or kits cannot be used interchangeably. Results cannot be interpreted as absolute evidence of the presence or absence of malignant disease.          Passed - Last BP in normal range    BP Readings from Last 1 Encounters:  09/26/24 114/70         Passed - Valid encounter within last 12 months    Recent Outpatient Visits           1 month ago Lower urinary tract symptoms (LUTS)   [place] [facility] at [facility], Vinay K, MD   1 month ago Lower urinary tract symptoms (LUTS)   [facility] at [facility], Vinay K, MD

## 2024-11-13 NOTE — Telephone Encounter (Signed)
 Please review medication refill request

## 2024-11-20 ENCOUNTER — Encounter: Payer: Self-pay | Admitting: Family Medicine

## 2024-11-20 ENCOUNTER — Other Ambulatory Visit: Payer: Self-pay | Admitting: Family Medicine

## 2024-11-20 ENCOUNTER — Ambulatory Visit: Admitting: Family Medicine

## 2024-11-20 VITALS — BP 128/80 | HR 68 | Ht 74.0 in | Wt 318.0 lb

## 2024-11-20 DIAGNOSIS — Z0000 Encounter for general adult medical examination without abnormal findings: Secondary | ICD-10-CM | POA: Diagnosis not present

## 2024-11-20 DIAGNOSIS — Z23 Encounter for immunization: Secondary | ICD-10-CM

## 2024-11-20 DIAGNOSIS — R399 Unspecified symptoms and signs involving the genitourinary system: Secondary | ICD-10-CM

## 2024-11-20 MED ORDER — TAMSULOSIN HCL 0.4 MG PO CAPS: 0.4000 mg | ORAL_CAPSULE | Freq: Every day | ORAL | 2 refills | Status: AC

## 2024-11-20 MED ORDER — ZEPBOUND 2.5 MG/0.5ML ~~LOC~~ SOAJ: 2.5000 mg | SUBCUTANEOUS | 0 refills | Status: DC

## 2024-11-20 NOTE — Progress Notes (Unsigned)
 Complete physical exam  Patient: Thomas Thompson    DOB: [birth year] 48 y.o.   MRN: [id number]  Chief Complaint  Patient presents with   Annual Exam    Subjective:    Thomas Thompson is a 48 y.o. male who presents today for a complete physical exam. He reports consuming a {diet types:17450} diet. {types:19826} He generally feels {DESC; WELL/FAIRLY WELL/POORLY:18703}. He reports sleeping {DESC; WELL/FAIRLY WELL/POORLY:18703}. He {does/does not:200015} have additional problems to discuss today.   Discussed the use of AI scribe software for clinical note transcription with the patient, who gave verbal consent to proceed.  History of Present Illness    Most recent fall risk assessment:    11/20/2024    3:55 PM  Fall Risk   Falls in the past year? 0  Number falls in past yr: 0  Injury with Fall? 0  Risk for fall due to : No Fall Risks  Follow up Falls evaluation completed     Most recent depression screenings:    11/20/2024    3:55 PM 09/18/2024    3:26 PM  PHQ 2/9 Scores  PHQ - 2 Score 0 0  PHQ- 9 Score  1      Data saved with a previous flowsheet row definition    {VISON DENTAL STD PSA (Optional):27386} {History (Optional):23778}  Patient Care Team: Dany Walther K, MD as PCP - General (Family Medicine)   Review of Systems  All other systems reviewed and are negative.     Objective:    BP 128/80   Pulse 68   Ht 6' 2 (1.88 m)   Wt (!) 318 lb (144.2 kg)   SpO2 95%   BMI 40.83 kg/m  {Vitals History (Optional):23777}  Physical Exam Vitals and nursing note reviewed.  Constitutional:      Appearance: Normal appearance.  HENT:     Head: Normocephalic.     Right Ear: External ear normal.     Left Ear: External ear normal.  Eyes:     Conjunctiva/sclera: Conjunctivae normal.  Cardiovascular:     Rate and Rhythm: Normal rate.  Pulmonary:     Effort: Pulmonary effort is normal. No respiratory distress.  Abdominal:     Palpations: Abdomen is  soft.  Musculoskeletal:        General: Normal range of motion.  Skin:    General: Skin is warm.  Neurological:     Mental Status: He is alert and oriented to person, place, and time.  Psychiatric:        Mood and Affect: Mood normal.     {PhysExam Abridge (Optional):210964309}  No results found for any visits on 11/20/24. {Show previous labs (optional):23779}     Assessment & Plan:    Routine Health Maintenance and Physical Exam Immunization History  Administered Date(s) Administered   Influenza, Seasonal, Injecte, Preservative Fre 09/18/2024    Health Maintenance  Topic Date Due   Hepatitis C Screening  Never done   DTaP/Tdap/Td (1 - Tdap) Never done   Hepatitis B Vaccines 19-59 Average Risk (1 of 3 - 19+ 3-dose series) Never done   Colonoscopy  Never done   COVID-19 Vaccine (1 - 2025-26 season) 10/04/2025 (Originally 08/21/2024)   Influenza Vaccine  Completed   HIV Screening  Completed   Pneumococcal Vaccine  Aged Out   HPV VACCINES  Aged Out   Meningococcal B Vaccine  Aged Out    Discussed health benefits of physical activity, and encouraged him to engage in  regular exercise appropriate for his age and condition.  Problem List Items Addressed This Visit   None Visit Diagnoses       Lower urinary tract symptoms (LUTS)           Assessment and Plan Assessment & Plan     No follow-ups on file.    Vinary K Mcgregor Tinnon, MD [facility] at [facility]

## 2024-11-23 NOTE — Telephone Encounter (Signed)
 Please completer PA.  KP

## 2024-11-23 NOTE — Telephone Encounter (Signed)
 Requested medication (s) are due for refill today: No  Requested medication (s) are on the active medication list: Yes  Last refill:  11/20/24  Future visit scheduled: Yes  Notes to clinic:  See pharmacy requests.    Requested Prescriptions  Pending Prescriptions Disp Refills   Liraglutide -Weight Management 18 MG/3ML SOPN [Pharmacy Med Name: LIRAGLUTIDE 5-PAK 18 MG/3 ML]  0     Endocrinology:  Diabetes - GLP-1 Receptor Agonists Passed - 11/23/2024  3:19 PM      Passed - HBA1C is between 0 and 7.9 and within 180 days    Hgb A1c MFr Bld  Date Value Ref Range Status  09/18/2024 5.0 4.8 - 5.6 % Final    Comment:             Prediabetes: 5.7 - 6.4          Diabetes: >6.4          Glycemic control for adults with diabetes: <7.0          Passed - Valid encounter within last 6 months    Recent Outpatient Visits           3 days ago Annual physical exam   [facility] at [facility], Vinay K, MD   1 month ago Lower urinary tract symptoms (LUTS)   [place] [facility] at [facility], Vinay K, MD   2 months ago Lower urinary tract symptoms (LUTS)   [facility] at [facility], Vinay K, MD

## 2024-11-24 ENCOUNTER — Other Ambulatory Visit (HOSPITAL_COMMUNITY): Payer: Self-pay

## 2024-11-24 NOTE — Telephone Encounter (Signed)
 Good morning, zepbound  is plan/benefit exclusion but it looks like his plan will cover the following but will still need a PA- orlistat, saxenda, wegovy, or qsymia

## 2024-11-27 ENCOUNTER — Telehealth: Payer: Self-pay | Admitting: Pharmacy Technician

## 2024-11-27 ENCOUNTER — Other Ambulatory Visit: Payer: Self-pay | Admitting: Family Medicine

## 2024-11-27 ENCOUNTER — Other Ambulatory Visit (HOSPITAL_COMMUNITY): Payer: Self-pay

## 2024-11-27 MED ORDER — WEGOVY 0.25 MG/0.5ML ~~LOC~~ SOAJ: 0.2500 mg | SUBCUTANEOUS | 0 refills | Status: DC

## 2024-11-27 NOTE — Telephone Encounter (Signed)
 Pharmacy Patient Advocate Encounter   Received notification from CoverMyMeds that prior authorization for Wegovy  0.25MG /0.5ML auto-injectors is required/requested.   Insurance verification completed.   The patient is insured through [facility].   Per test claim: PA required; PA started via CoverMyMeds. [place] . Waiting for clinical questions to populate.

## 2024-11-27 NOTE — Telephone Encounter (Signed)
 Pharmacy Patient Advocate Encounter  Received notification from [facility] that Prior Authorization for Wegovy  0.25MG /0.5ML auto-injectors has been APPROVED from 11/27/24 to 06/25/25. Unable to obtain price due to refill too soon rejection, last fill date 11/27/24 next available fill date12/29/25   PA #/Case ID/Reference #: [id number]

## 2024-11-27 NOTE — Telephone Encounter (Signed)
 Wegovy  sent to [facility] [place].

## 2024-11-27 NOTE — Telephone Encounter (Signed)
 Please complete PA for wegovy .  KP

## 2024-11-27 NOTE — Telephone Encounter (Signed)
 Good morning, zepbound  is plan/benefit exclusion but it looks like his plan will cover the following but will still need a PA- orlistat, saxenda, wegovy, or qsymia

## 2024-11-28 ENCOUNTER — Other Ambulatory Visit (HOSPITAL_COMMUNITY): Payer: Self-pay

## 2024-11-28 NOTE — Telephone Encounter (Signed)
 PA has been submitted and documented in separate encounter, please sign off on rx in this encounter as PA team is unable to resolve RX requests. Thank you

## 2024-12-19 ENCOUNTER — Encounter: Payer: Self-pay | Admitting: Gastroenterology

## 2024-12-20 ENCOUNTER — Encounter: Payer: Self-pay | Admitting: Gastroenterology

## 2024-12-26 NOTE — H&P (Signed)
 "  Clotilda Schaffer, MD  [address], [address] [place], [place] [address] Phone: [phone] Fax : [phone]  Primary Care Physician:  Kotturi, Vinay K, MD Primary Gastroenterologist:  Dr. Schaffer  Pre-Procedure History & Physical: HPI:  Thomas Thompson is a 49 y.o. male is here for a screening colonoscopy.  Prior colonoscopy? Fhx CRC? Blood thinners?  Past Medical History:  Diagnosis Date   Medical history non-contributory    Morbid obesity [facility])     Past Surgical History:  Procedure Laterality Date   CHOLECYSTECTOMY N/A 03/26/2021   Procedure: LAPAROSCOPIC CHOLECYSTECTOMY;  Surgeon: Teresa Lonni HERO, MD;  Location: [facility] OR;  Service: General;  Laterality: N/A;   ENDOSCOPIC RETROGRADE CHOLANGIOPANCREATOGRAPHY (ERCP) WITH PROPOFOL  N/A 03/25/2021   Procedure: ENDOSCOPIC RETROGRADE CHOLANGIOPANCREATOGRAPHY (ERCP) WITH PROPOFOL ;  Surgeon: Teressa Toribio SQUIBB, MD;  Location: [facility] ENDOSCOPY;  Service: Endoscopy;  Laterality: N/A;   REMOVAL OF STONES  03/25/2021   Procedure: REMOVAL OF STONES;  Surgeon: Teressa Toribio SQUIBB, MD;  Location[facility] ENDOSCOPY;  Service: Endoscopy;;   SPHINCTEROTOMY  03/25/2021   Procedure: ANNETT;  Surgeon: Teressa Toribio SQUIBB, MD;  Location: [facility] ENDOSCOPY;  Service: Endoscopy;;    Prior to Admission medications  Medication Sig Start Date End Date Taking? Authorizing Provider  acetaminophen  (TYLENOL ) 500 MG tablet Take 2 tablets (1,000 mg total) by mouth every 6 (six) hours as needed for mild pain or fever. 03/27/21  Yes Vicci Sor R, PA-C  clobetasol cream (TEMOVATE) 0.05 % Apply 1 Application topically 2 (two) times daily. Patient taking differently: Apply 1 Application topically as needed. 08/01/24  Yes [provider]  DUPIXENT 300 MG/2ML SOAJ  02/03/24  Yes [provider]  ibuprofen  (ADVIL ) 200 MG tablet Take 2 tablets (400 mg total) by mouth every 6 (six) hours as needed for fever, headache or mild pain. 03/27/21  Yes Vicci Sor SAUNDERS, PA-C   semaglutide -weight management (WEGOVY ) 0.25 MG/0.5ML SOAJ SQ injection Inject 0.25 mg into the skin once a week. 11/27/24  Yes Kotturi, Vinay K, MD  tamsulosin  (FLOMAX ) 0.4 MG CAPS capsule Take 1 capsule (0.4 mg total) by mouth daily. 11/20/24  Yes Kotturi, Vinay K, MD  omeprazole (PRILOSEC) 40 MG capsule Take 1 capsule by mouth daily. Patient not taking: Reported on 09/18/2024 03/14/21   [provider]    Allergies as of 10/12/2024   (No Known Allergies)    History reviewed. No pertinent family history.  Social History   Socioeconomic History   Marital status: Married    Spouse name: Not on file   Number of children: Not on file   Years of education: Not on file   Highest education level: Not on file  Occupational History   Not on file  Tobacco Use   Smoking status: Never   Smokeless tobacco: Never  Substance and Sexual Activity   Alcohol use: Never   Drug use: Never   Sexual activity: Not on file  Other Topics Concern   Not on file  Social History Narrative   Not on file   Social Drivers of Health   Tobacco Use: Low Risk (12/19/2024)   Patient History    Smoking Tobacco Use: Never    Smokeless Tobacco Use: Never    Passive Exposure: Not on file  Financial Resource Strain: Not on file  Food Insecurity: No Food Insecurity (09/18/2024)   Epic    Worried About Programme Researcher, Broadcasting/film/video in the Last Year: Never true    [employer] of Food in  the Last Year: Never true  Transportation Needs: No Transportation Needs (09/18/2024)   Epic    Lack of Transportation (Medical): No    Lack of Transportation (Non-Medical): No  Physical Activity: Not on file  Stress: Not on file  Social Connections: Not on file  Intimate Partner Violence: Not At Risk (09/18/2024)   Epic    Fear of Current or Ex-Partner: No    Emotionally Abused: No    Physically Abused: No    Sexually Abused: No  Depression (PHQ2-9): Low Risk (11/20/2024)   Depression (PHQ2-9)    PHQ-2 Score: 0  Alcohol  Screen: Not on file  Housing: Unknown (09/18/2024)   Epic    Unable to Pay for Housing in the Last Year: No    Number of Times Moved in the Last Year: Not on file    Homeless in the Last Year: No  Utilities: Not At Risk (09/18/2024)   Epic    Threatened with loss of utilities: No  Health Literacy: Not on file    Review of Systems: See HPI, otherwise negative ROS  Physical Exam: Ht 6' 2 (1.88 m)   Wt (!) 138.8 kg   BMI 39.29 kg/m  CONSTITUTIONAL: Well-appearing in no acute distress.  HEENT: Pupils equal, round, Extraocular movements intact. Conjunctivae clear NECK: Neck supple CARDIOVASCULAR: Regular rate, no LE edema  RESPIRATORY: No labored breathing  ABDOMEN: Abdomen soft, nontender, not distended, no guarding, no rigidity SKIN: No apparent skin rashes or lesions. NEUROLOGIC: Normal speech, no focal findings. Mental status alert and oriented x4. PSYCHIATRIC: Mood and affect normal.   Impression/Plan: Thomas Thompson is now here to undergo a screening colonoscopy.  Risks, benefits, and alternatives regarding colonoscopy have been reviewed with the patient.  Questions have been answered.  All parties agreeable.  "

## 2024-12-27 ENCOUNTER — Ambulatory Visit
Admission: RE | Admit: 2024-12-27 | Discharge: 2024-12-27 | Disposition: A | Discharge status: Home / Self Care | Attending: Gastroenterology | Admitting: Gastroenterology

## 2024-12-27 ENCOUNTER — Encounter: Payer: Self-pay | Admitting: Anesthesiology

## 2024-12-27 ENCOUNTER — Other Ambulatory Visit: Payer: Self-pay

## 2024-12-27 ENCOUNTER — Ambulatory Visit: Payer: Self-pay | Admitting: Anesthesiology

## 2024-12-27 ENCOUNTER — Encounter
Admission: RE | Disposition: A | Discharge status: Home / Self Care | Payer: Self-pay | Source: Home / Self Care | Attending: Gastroenterology

## 2024-12-27 ENCOUNTER — Encounter: Payer: Self-pay | Admitting: Gastroenterology

## 2024-12-27 DIAGNOSIS — D124 Benign neoplasm of descending colon: Secondary | ICD-10-CM | POA: Insufficient documentation

## 2024-12-27 DIAGNOSIS — D120 Benign neoplasm of cecum: Secondary | ICD-10-CM | POA: Diagnosis not present

## 2024-12-27 DIAGNOSIS — K640 First degree hemorrhoids: Secondary | ICD-10-CM | POA: Diagnosis not present

## 2024-12-27 DIAGNOSIS — K635 Polyp of colon: Secondary | ICD-10-CM

## 2024-12-27 DIAGNOSIS — Z1211 Encounter for screening for malignant neoplasm of colon: Secondary | ICD-10-CM | POA: Diagnosis present

## 2024-12-27 HISTORY — DX: Morbid (severe) obesity due to excess calories: E66.01

## 2024-12-27 HISTORY — DX: Other specified health status: Z78.9

## 2024-12-27 HISTORY — PX: COLONOSCOPY: SHX5424

## 2024-12-27 SURGERY — COLONOSCOPY
Anesthesia: General

## 2024-12-27 MED ORDER — PROPOFOL 10 MG/ML IV BOLUS
INTRAVENOUS | Status: DC | PRN
  Administered 2024-12-27: 120 mg via INTRAVENOUS
  Administered 2024-12-27: 30 mg via INTRAVENOUS
  Administered 2024-12-27 (×2): 40 mg via INTRAVENOUS
  Administered 2024-12-27: 30 mg via INTRAVENOUS
  Administered 2024-12-27: 40 mg via INTRAVENOUS
  Administered 2024-12-27: 30 mg via INTRAVENOUS

## 2024-12-27 MED ORDER — PROPOFOL 1000 MG/100ML IV EMUL
INTRAVENOUS | Status: AC
  Filled 2024-12-27: qty 100

## 2024-12-27 MED ORDER — LIDOCAINE HCL (PF) 2 % IJ SOLN
INTRAMUSCULAR | Status: AC
  Filled 2024-12-27: qty 5

## 2024-12-27 MED ORDER — LACTATED RINGERS IV SOLN: INTRAVENOUS | Status: DC

## 2024-12-27 MED ORDER — PHENYLEPHRINE HCL 10 % OP SOLN
OPHTHALMIC | Status: AC
  Filled 2024-12-27: qty 5

## 2024-12-27 MED ORDER — GLYCOPYRROLATE 0.2 MG/ML IJ SOLN
INTRAMUSCULAR | Status: AC
  Filled 2024-12-27: qty 1

## 2024-12-27 MED ORDER — TETRACAINE HCL 0.5 % OP SOLN
OPHTHALMIC | Status: AC
  Filled 2024-12-27: qty 4

## 2024-12-27 MED ORDER — STERILE WATER FOR IRRIGATION IR SOLN
Status: DC | PRN
  Administered 2024-12-27: 500 mL

## 2024-12-27 MED ORDER — SODIUM CHLORIDE 0.9 % IV SOLN: INTRAVENOUS | Status: DC

## 2024-12-27 MED ORDER — CYCLOPENTOLATE HCL 2 % OP SOLN
OPHTHALMIC | Status: AC
  Filled 2024-12-27: qty 2

## 2024-12-27 SURGICAL SUPPLY — 8 items
GOWN CVR UNV OPN BCK APRN NK (MISCELLANEOUS) ×2 IMPLANT
INJECTABLE ELEVIEW COMP 10 (MISCELLANEOUS) IMPLANT
INJECTOR VARIJECT VIN23 (MISCELLANEOUS) IMPLANT
KIT PROCEDURE OLYMPUS (MISCELLANEOUS) ×1 IMPLANT
MANIFOLD NEPTUNE II (INSTRUMENTS) ×1 IMPLANT
SNARE COLD EXACTO (MISCELLANEOUS) IMPLANT
TRAP ETRAP POLY (MISCELLANEOUS) IMPLANT
WATER STERILE IRR 250ML POUR (IV SOLUTION) ×1 IMPLANT

## 2024-12-27 NOTE — Anesthesia Postprocedure Evaluation (Signed)
"   Anesthesia Post Note  Patient: Thomas Thompson  Procedure(s) Performed: COLONOSCOPY WITH POLYPECTOMY  Patient location during evaluation: PACU Anesthesia Type: General Level of consciousness: awake and alert Pain management: pain level controlled Vital Signs Assessment: post-procedure vital signs reviewed and stable Respiratory status: spontaneous breathing, nonlabored ventilation, respiratory function stable and patient connected to nasal cannula oxygen Cardiovascular status: stable and blood pressure returned to baseline Postop Assessment: no apparent nausea or vomiting Anesthetic complications: no   No notable events documented.   Last Vitals:  Vitals:   12/27/24 0758 12/27/24 0800  BP: 115/66 114/68  Pulse: 72 68  Resp: 14 17  Temp: (!) 36.1 C   SpO2: 98% (!) 87%    Last Pain:  Vitals:   12/27/24 0758  TempSrc:   PainSc: Asleep                 Redell MARLA Breaker      "

## 2024-12-27 NOTE — Anesthesia Postprocedure Evaluation (Signed)
"   Anesthesia Post Note  Patient: Thomas Thompson  Procedure(s) Performed: COLONOSCOPY WITH POLYPECTOMY  Patient location during evaluation: PACU Anesthesia Type: General Level of consciousness: awake and alert Pain management: pain level controlled Vital Signs Assessment: post-procedure vital signs reviewed and stable Respiratory status: spontaneous breathing, nonlabored ventilation, respiratory function stable and patient connected to nasal cannula oxygen Cardiovascular status: stable and blood pressure returned to baseline Postop Assessment: no apparent nausea or vomiting Anesthetic complications: no   No notable events documented.   Last Vitals:  Vitals:   12/27/24 0652  BP: (!) 135/93  Pulse: 73  Resp: 14  Temp: (!) 36.2 C  SpO2: 96%    Last Pain:  Vitals:   12/27/24 0652  TempSrc: Temporal  PainSc: 0-No pain                 Redell MARLA Breaker      "

## 2024-12-27 NOTE — Op Note (Signed)
 [facility]enter[facility] Patient Name: Thomas Thompson Procedure Date: 12/27/2024 7:18 AM MRN: [id number] Account #: [id number] Date of Birth: 1976-06-29 Admit Type: Outpatient Age: 49 Room: [facility] OR ROOM 01 Gender: Male Note Status: Finalized Instrument Name: Colonoscope [id number] Procedure:             Colonoscopy Indications:           Screening for colorectal malignant neoplasm Providers:             Clotilda Schaffer, MD Referring MD:          Mackey LOIS Ny (Referring MD) Medicines:             Propofol  per Anesthesia Complications:         No immediate complications. Procedure:             Pre-Anesthesia Assessment:                        - Prior to the procedure, a History and Physical was                         performed, and patient medications and allergies were                         reviewed. The patient's tolerance of previous                         anesthesia was also reviewed. The risks and benefits                         of the procedure and the sedation options and risks                         were discussed with the patient. All questions were                         answered, and informed consent was obtained. Prior                         Anticoagulants: The patient has taken no anticoagulant                         or antiplatelet agents. ASA Grade Assessment: III - A                         patient with severe systemic disease. After reviewing                         the risks and benefits, the patient was deemed in                         satisfactory condition to undergo the procedure.                        After obtaining informed consent, the colonoscope was                         passed under direct vision. Throughout the procedure,  the patient's blood pressure, pulse, and oxygen                         saturations were monitored continuously. The                         Colonoscope was introduced through  the anus and                         advanced to the 10 cm into the ileum. The terminal                         ileum, ileocecal valve, appendiceal orifice, and                         rectum were photographed. Findings:      A 15 mm polyp was found in the cecum. The polyp was sessile.       Preparations were made for mucosal resection. Demarcation of the lesion       was performed during the procedure to clearly identify the boundaries of       the lesion. Eleview was injected to raise the lesion. Snare mucosal       resection was performed. Resection and retrieval were complete. Resected       tissue margins were examined and clear of polyp tissue. Estimated blood       loss: none.      A 4 mm polyp was found in the descending colon. The polyp was sessile.       The polyp was removed with a cold snare. Resection and retrieval were       complete. Estimated blood loss: none.      Internal hemorrhoids were found during retroflexion. The hemorrhoids       were Grade I (internal hemorrhoids that do not prolapse).      The terminal ileum appeared normal. Impression:            - One 15 mm polyp in the cecum, removed with mucosal                         resection. Resected and retrieved.                        - One 4 mm polyp in the descending colon, removed with                         a cold snare. Resected and retrieved.                        - Internal hemorrhoids.                        - The examined portion of the ileum was normal.                        - Mucosal resection was performed. Resection and                         retrieval were complete. Recommendation:        - Patient has a contact number available for  emergencies. The signs and symptoms of potential                         delayed complications were discussed with the patient.                         Return to normal activities tomorrow. Written                         discharge instructions  were provided to the patient.                        - High fiber diet.                        - Continue present medications.                        - Await pathology results.                        - Repeat colonoscopy in 3 years.                        - The findings and recommendations were discussed with                         the designated responsible adult. Procedure Code(s):     --- Professional ---                        [id number], Colonoscopy, flexible; with endoscopic mucosal                         resection                        45385, 59, Colonoscopy, flexible; with removal of                         tumor(s), polyp(s), or other lesion(s) by snare                         technique Diagnosis Code(s):     --- Professional ---                        Z12.11, Encounter for screening for malignant neoplasm                         of colon                        D12.0, Benign neoplasm of cecum                        D12.4, Benign neoplasm of descending colon                        K64.0, First degree hemorrhoids CPT copyright 2022 [employer]. All rights reserved. The codes documented in this report are preliminary and upon coder review may  be revised to meet current compliance requirements. Clotilda Schaffer, MD  12/27/2024 8:04:15 AM Number of Addenda: 0 Note Initiated On: 12/27/2024 7:18 AM Estimated Blood Loss:  Estimated blood loss: none.      [facility]

## 2024-12-27 NOTE — Anesthesia Preprocedure Evaluation (Signed)
"                                    Anesthesia Evaluation  Patient identified by MRN, date of birth, ID band Patient awake    Reviewed: Allergy & Precautions, H&P , NPO status , Patient's Chart, lab work & pertinent test results, reviewed documented beta blocker date and time   Airway Mallampati: II  TM Distance: >3 FB Neck ROM: Full    Dental no notable dental hx.    Pulmonary neg pulmonary ROS   Pulmonary exam normal breath sounds clear to auscultation       Cardiovascular Exercise Tolerance: Good negative cardio ROS Normal cardiovascular exam Rhythm:Regular Rate:Normal     Neuro/Psych negative neurological ROS  negative psych ROS   GI/Hepatic negative GI ROS, Neg liver ROS,,,  Endo/Other  negative endocrine ROS    Renal/GU negative Renal ROS  negative genitourinary   Musculoskeletal negative musculoskeletal ROS (+)    Abdominal   Peds negative pediatric ROS (+)  Hematology negative hematology ROS (+)   Anesthesia Other Findings   Reproductive/Obstetrics negative OB ROS                              Anesthesia Physical Anesthesia Plan  ASA: 3  Anesthesia Plan: General   Post-op Pain Management:    Induction: Intravenous  PONV Risk Score and Plan: 1  Airway Management Planned: Nasal Cannula  Additional Equipment:   Intra-op Plan:   Post-operative Plan: Extubation in OR  Informed Consent: I have reviewed the patients History and Physical, chart, labs and discussed the procedure including the risks, benefits and alternatives for the proposed anesthesia with the patient or authorized representative who has indicated his/her understanding and acceptance.     Dental advisory given  Plan Discussed with: CRNA  Anesthesia Plan Comments:         Anesthesia Quick Evaluation  "

## 2024-12-27 NOTE — Transfer of Care (Signed)
 Immediate Anesthesia Transfer of Care Note  Patient: Thomas Thompson  Procedure(s) Performed: COLONOSCOPY WITH POLYPECTOMY  Patient Location: PACU  Anesthesia Type: General  Level of Consciousness: awake, alert  and patient cooperative  Airway and Oxygen Therapy: Patient Spontanous Breathing and Patient connected to supplemental oxygen  Post-op Assessment: Post-op Vital signs reviewed, Patient's Cardiovascular Status Stable, Respiratory Function Stable, Patent Airway and No signs of Nausea or vomiting  Post-op Vital Signs: Reviewed and stable  Complications: No notable events documented.

## 2024-12-28 ENCOUNTER — Encounter: Payer: Self-pay | Admitting: Family Medicine

## 2024-12-28 ENCOUNTER — Ambulatory Visit: Payer: Self-pay | Admitting: Gastroenterology

## 2024-12-28 ENCOUNTER — Other Ambulatory Visit: Payer: Self-pay | Admitting: Family Medicine

## 2024-12-28 ENCOUNTER — Ambulatory Visit: Admitting: Family Medicine

## 2024-12-28 LAB — SURGICAL PATHOLOGY

## 2024-12-29 NOTE — Telephone Encounter (Signed)
 Requested Prescriptions  Pending Prescriptions Disp Refills   WEGOVY  0.25 MG/0.5ML SOAJ SQ injection [Pharmacy Med Name: WEGOVY  0.25 MG/0.5 ML PEN] 2 mL 0    Sig: INJECT 0.25MG  INTO THE SKIN ONE TIME PER WEEK     Endocrinology:  Diabetes - GLP-1 Receptor Agonists - semaglutide  Passed - 12/29/2024  8:30 AM      Passed - HBA1C in normal range and within 180 days    Hgb A1c MFr Bld  Date Value Ref Range Status  09/18/2024 5.0 4.8 - 5.6 % Final    Comment:             Prediabetes: 5.7 - 6.4          Diabetes: >6.4          Glycemic control for adults with diabetes: <7.0          Passed - Cr in normal range and within 360 days    Creatinine, Ser  Date Value Ref Range Status  09/18/2024 1.16 0.76 - 1.27 mg/dL Final         Passed - Valid encounter within last 6 months    Recent Outpatient Visits           1 month ago Annual physical exam   [facility] at [facility], Vinay K, MD   3 months ago Lower urinary tract symptoms (LUTS)   [facility] at [facility] [place], Vinay K, MD   3 months ago Lower urinary tract symptoms (LUTS)   [facility] at [facility], Vinay K, MD

## 2025-01-26 ENCOUNTER — Other Ambulatory Visit: Payer: Self-pay | Admitting: Family Medicine
